# Patient Record
Sex: Female | Born: 1990 | Race: Black or African American | Hispanic: No | Marital: Single | Smoking: Never smoker
Health system: Southern US, Community
[De-identification: ages and names within clinical notes are randomized; demographics above are authoritative.]

## PROBLEM LIST (undated history)

## (undated) ENCOUNTER — Inpatient Hospital Stay (HOSPITAL_COMMUNITY): Payer: Self-pay

## (undated) DIAGNOSIS — Z973 Presence of spectacles and contact lenses: Secondary | ICD-10-CM

## (undated) DIAGNOSIS — E282 Polycystic ovarian syndrome: Secondary | ICD-10-CM

## (undated) DIAGNOSIS — G253 Myoclonus: Secondary | ICD-10-CM

## (undated) DIAGNOSIS — F329 Major depressive disorder, single episode, unspecified: Secondary | ICD-10-CM

## (undated) DIAGNOSIS — Z8659 Personal history of other mental and behavioral disorders: Secondary | ICD-10-CM

## (undated) DIAGNOSIS — Z8669 Personal history of other diseases of the nervous system and sense organs: Secondary | ICD-10-CM

## (undated) DIAGNOSIS — N9489 Other specified conditions associated with female genital organs and menstrual cycle: Secondary | ICD-10-CM

## (undated) DIAGNOSIS — Z87440 Personal history of urinary (tract) infections: Secondary | ICD-10-CM

## (undated) DIAGNOSIS — B999 Unspecified infectious disease: Secondary | ICD-10-CM

## (undated) HISTORY — DX: Personal history of other mental and behavioral disorders: Z86.59

## (undated) HISTORY — PX: DILATION AND CURETTAGE OF UTERUS: SHX78

## (undated) HISTORY — DX: Polycystic ovarian syndrome: E28.2

## (undated) HISTORY — PX: NO PAST SURGERIES: SHX2092

## (undated) HISTORY — DX: Personal history of urinary (tract) infections: Z87.440

---
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
## 2010-05-31 ENCOUNTER — Emergency Department (HOSPITAL_COMMUNITY)
Admission: EM | Admit: 2010-05-31 | Discharge: 2010-05-31 | Disposition: A | Discharge status: Home / Self Care | Payer: BC Managed Care – PPO | Attending: Emergency Medicine | Admitting: Emergency Medicine

## 2010-05-31 DIAGNOSIS — G43909 Migraine, unspecified, not intractable, without status migrainosus: Secondary | ICD-10-CM | POA: Insufficient documentation

## 2011-02-14 ENCOUNTER — Ambulatory Visit (INDEPENDENT_AMBULATORY_CARE_PROVIDER_SITE_OTHER): Payer: BC Managed Care – PPO

## 2011-02-14 DIAGNOSIS — N760 Acute vaginitis: Secondary | ICD-10-CM

## 2011-02-14 DIAGNOSIS — N898 Other specified noninflammatory disorders of vagina: Secondary | ICD-10-CM

## 2011-04-18 ENCOUNTER — Emergency Department (HOSPITAL_COMMUNITY)
Admission: EM | Admit: 2011-04-18 | Discharge: 2011-04-18 | Disposition: A | Discharge status: Home / Self Care | Payer: Self-pay | Attending: Emergency Medicine | Admitting: Emergency Medicine

## 2011-04-18 ENCOUNTER — Encounter (HOSPITAL_COMMUNITY): Payer: Self-pay | Admitting: *Deleted

## 2011-04-18 DIAGNOSIS — B349 Viral infection, unspecified: Secondary | ICD-10-CM

## 2011-04-18 DIAGNOSIS — R197 Diarrhea, unspecified: Secondary | ICD-10-CM | POA: Insufficient documentation

## 2011-04-18 DIAGNOSIS — Z79899 Other long term (current) drug therapy: Secondary | ICD-10-CM | POA: Insufficient documentation

## 2011-04-18 DIAGNOSIS — R112 Nausea with vomiting, unspecified: Secondary | ICD-10-CM | POA: Insufficient documentation

## 2011-04-18 DIAGNOSIS — B9789 Other viral agents as the cause of diseases classified elsewhere: Secondary | ICD-10-CM | POA: Insufficient documentation

## 2011-04-18 DIAGNOSIS — R1110 Vomiting, unspecified: Secondary | ICD-10-CM

## 2011-04-18 LAB — URINALYSIS, ROUTINE W REFLEX MICROSCOPIC
Ketones, ur: 15 mg/dL — AB
Nitrite: NEGATIVE
Specific Gravity, Urine: 1.023 (ref 1.005–1.030)
pH: 7 (ref 5.0–8.0)

## 2011-04-18 LAB — POCT I-STAT, CHEM 8
Calcium, Ion: 1.22 mmol/L (ref 1.12–1.32)
Chloride: 104 mEq/L (ref 96–112)
Glucose, Bld: 85 mg/dL (ref 70–99)
HCT: 51 % — ABNORMAL HIGH (ref 36.0–46.0)

## 2011-04-18 LAB — URINE MICROSCOPIC-ADD ON

## 2011-04-18 MED ORDER — SODIUM CHLORIDE 0.9 % IV BOLUS (SEPSIS)
1000.0000 mL | Freq: Once | INTRAVENOUS | Status: AC
  Administered 2011-04-18: 1000 mL via INTRAVENOUS

## 2011-04-18 MED ORDER — ONDANSETRON HCL 4 MG/2ML IJ SOLN
4.0000 mg | Freq: Once | INTRAMUSCULAR | Status: AC
  Administered 2011-04-18: 4 mg via INTRAVENOUS
  Filled 2011-04-18: qty 2

## 2011-04-18 MED ORDER — PROMETHAZINE HCL 25 MG PO TABS: 25.0000 mg | ORAL_TABLET | Freq: Four times a day (QID) | ORAL | Status: DC | PRN

## 2011-04-18 MED ORDER — MORPHINE SULFATE 4 MG/ML IJ SOLN
4.0000 mg | Freq: Once | INTRAMUSCULAR | Status: AC
  Administered 2011-04-18: 4 mg via INTRAVENOUS
  Filled 2011-04-18: qty 1

## 2011-04-18 NOTE — ED Provider Notes (Signed)
History     CSN: [id number]  Arrival date & time 04/18/11  1901   First MD Initiated Contact with Patient 04/18/11 2012      Chief Complaint  Patient presents with  . Emesis     Patient is a 21 y.o. female presenting with vomiting. The history is provided by the patient.  Emesis  This is a new problem. The current episode started 6 to 12 hours ago. The problem has been gradually improving. The emesis has an appearance of stomach contents. There has been no fever. Associated symptoms include abdominal pain and diarrhea. Pertinent negatives include no chills, no cough, no fever and no URI.   patient reports onset of nausea vomiting and diarrhea this morning. States she has had approximately 15 or more episodes of vomiting and 3-4 episodes of diarrhea. She also complains of diffuse intermittent abdominal pain that is worse just before vomiting or diarrhea and seems to improve after. She denies cough, fever, urinary tract infection symptoms or other associated symptoms.`  History reviewed. No pertinent past medical history.  History reviewed. No pertinent past surgical history.  History reviewed. No pertinent family history.  History  Substance Use Topics  . Smoking status: Never Smoker   . Smokeless tobacco: Not on file  . Alcohol Use: No    OB History    Grav Para Term Preterm Abortions TAB SAB Ect Mult Living                  Review of Systems  Constitutional: Negative.  Negative for fever and chills.  HENT: Negative.   Eyes: Negative.   Respiratory: Negative.  Negative for cough.   Cardiovascular: Negative.   Gastrointestinal: Positive for vomiting, abdominal pain and diarrhea.  Genitourinary: Negative.   Musculoskeletal: Negative.   Skin: Negative.   Neurological: Negative.   Hematological: Negative.   Psychiatric/Behavioral: Negative.     Allergies  Sulfa antibiotics  Home Medications   Current Outpatient Rx  Name Route Sig Dispense Refill  .  AMITRIPTYLINE HCL 25 MG PO TABS Oral Take 25 mg by mouth at bedtime.    Marland Kitchen LEVETIRACETAM 750 MG PO TABS Oral Take 1,500 mg by mouth 2 (two) times daily.      BP 122/72  Pulse 123  Temp(Src) 98.2 F (36.8 C) (Oral)  Resp 18  SpO2 99%  LMP 04/11/2011  Physical Exam  Constitutional: She is oriented to person, place, and time. She appears well-developed and well-nourished.  HENT:  Head: Normocephalic and atraumatic.  Eyes: Conjunctivae are normal.  Neck: Neck supple.  Cardiovascular: Normal rate and regular rhythm.   Pulmonary/Chest: Effort normal and breath sounds normal.  Abdominal: Soft. Bowel sounds are normal.       No focal abdominal TTP  Musculoskeletal: Normal range of motion.  Neurological: She is alert and oriented to person, place, and time.  Skin: Skin is warm and dry. No erythema.  Psychiatric: She has a normal mood and affect.    ED Course  Procedures  The patient reports feeling much better after the fluids and medication. There have been no further episodes of vomiting or diarrhea since arrival to the department. She has been tolerating PO fluids prior to discharge. We will plan to discharge home with medication for nausea and encourage followup with PCP as needed. Patient agreeable with plan.  Labs Reviewed  URINALYSIS, ROUTINE W REFLEX MICROSCOPIC - Abnormal; Notable for the following:    APPearance CLOUDY (*)    Ketones, ur 15 (*)  Leukocytes, UA SMALL (*)    All other components within normal limits  URINE MICROSCOPIC-ADD ON - Abnormal; Notable for the following:    Squamous Epithelial / LPF MANY (*)    All other components within normal limits  POCT I-STAT, CHEM 8 - Abnormal; Notable for the following:    Hemoglobin 17.3 (*)    HCT 51.0 (*)    All other components within normal limits  POCT PREGNANCY, URINE   Results for orders placed during the hospital encounter of 04/18/11  URINALYSIS, ROUTINE W REFLEX MICROSCOPIC      Component Value Range    Color, Urine YELLOW  YELLOW    APPearance CLOUDY (*) CLEAR    Specific Gravity, Urine 1.023  1.005 - 1.030    pH 7.0  5.0 - 8.0    Glucose, UA NEGATIVE  NEGATIVE (mg/dL)   Hgb urine dipstick NEGATIVE  NEGATIVE    Bilirubin Urine NEGATIVE  NEGATIVE    Ketones, ur 15 (*) NEGATIVE (mg/dL)   Protein, ur NEGATIVE  NEGATIVE (mg/dL)   Urobilinogen, UA 1.0  0.0 - 1.0 (mg/dL)   Nitrite NEGATIVE  NEGATIVE    Leukocytes, UA SMALL (*) NEGATIVE   POCT PREGNANCY, URINE      Component Value Range   Preg Test, Ur NEGATIVE  NEGATIVE   URINE MICROSCOPIC-ADD ON      Component Value Range   Squamous Epithelial / LPF MANY (*) RARE    WBC, UA 0-2  <3 (WBC/hpf)   Urine-Other MUCOUS PRESENT    POCT I-STAT, CHEM 8      Component Value Range   Sodium 142  135 - 145 (mEq/L)   Potassium 3.8  3.5 - 5.1 (mEq/L)   Chloride 104  96 - 112 (mEq/L)   BUN 10  6 - 23 (mg/dL)   Creatinine, Ser 0.27  0.50 - 1.10 (mg/dL)   Glucose, Bld 85  70 - 99 (mg/dL)   Calcium, Ion 2.53  6.64 - 1.32 (mmol/L)   TCO2 25  0 - 100 (mmol/L)   Hemoglobin 17.3 (*) 12.0 - 15.0 (g/dL)   HCT 40.3 (*) 47.4 - 46.0 (%)       1. Vomiting and diarrhea   2. Viral syndrome       MDM  HPI/PE and clinical findings/course c/w 1. Vomiting and diarrhea (no further episodes of V/D, feeling much better after IV hydration , intermittent diffuse abdominal pain without focal TTP, resolved, labs without acute findings) 2. Mild dehydration 3. Viral Syndrome   Medical screening examination/treatment/procedure(s) were performed by non-physician practitioner and as supervising physician I was immediately available for consultation/collaboration. Osvaldo Human, M.D.      Roma Kayser Schorr, NP 04/18/11 2236  Carleene Cooper III, MD 04/20/11 [phone]

## 2011-04-18 NOTE — ED Notes (Signed)
Vomiting and diarrhea since yesterday with intermittent mild abd pain. lmp this past week

## 2011-04-18 NOTE — Discharge Instructions (Signed)
Please review the instructions below. Your labwork tonight was completely normal. Your urine shows that you were mildly dehydrated. The likely source of your nausea, vomiting and diarrhea is a viral syndrome. Take the medication as needed for nausea. If diarrhea returns and persists you can get over-the-counter Imodium and take as directed. Rest and drink plenty of liquids for the next 24-48 hours. Return for worsening symptoms, otherwise follow up with a primary care physician. See "[facility]" number above,  and the resource list below to assist you in getting established with a primary care physician.    B.R.A.T. Diet Your doctor has recommended the B.R.A.T. diet for you or your child until the condition improves. This is often used to help control diarrhea and vomiting symptoms. If you or your child can tolerate clear liquids, you may have:  Bananas.   Rice.   Applesauce.   Toast (and other simple starches such as crackers, potatoes, noodles).  Be sure to avoid dairy products, meats, and fatty foods until symptoms are better. Fruit juices such as apple, grape, and prune juice can make diarrhea worse. Avoid these. Continue this diet for 2 days or as instructed by your caregiver. Document Released: 01/18/2005 Document Revised: 01/07/2011 Document Reviewed: 07/07/2006 [facility] Patient Information 2012 [place], [place].Diet for Diarrhea, Adult Having frequent, runny stools (diarrhea) has many causes. Diarrhea may be caused or worsened by food or drink. Diarrhea may be relieved by changing your diet. IF YOU ARE NOT TOLERATING SOLID FOODS:  Drink enough water and fluids to keep your urine clear or pale yellow.   Avoid sugary drinks and sodas as well as milk-based beverages.   Avoid beverages containing caffeine and alcohol.   You may try rehydrating beverages. You can make your own by following this recipe:    tsp table salt.    tsp baking soda.   ? tsp salt substitute (potassium  chloride).   1 tbs + 1 tsp sugar.   1 qt water.  As your stools become more solid, you can start eating solid foods. Add foods one at a time. If a certain food causes your diarrhea to get worse, avoid that food and try other foods. A low fiber, low-fat, and lactose-free diet is recommended. Small, frequent meals may be better tolerated.  Starches  Allowed:  White, [place], and pita breads, plain rolls, buns, bagels. Plain muffins, matzo. Soda, saltine, or graham crackers. Pretzels, melba toast, zwieback. Cooked cereals made with water: cornmeal, farina, cream cereals. Dry cereals: refined corn, wheat, rice. Potatoes prepared any way without skins, refined macaroni, spaghetti, noodles, refined rice.   Avoid:  Bread, rolls, or crackers made with whole wheat, multi-grains, rye, bran seeds, nuts, or coconut. Corn tortillas or taco shells. Cereals containing whole grains, multi-grains, bran, coconut, nuts, or raisins. Cooked or dry oatmeal. Coarse wheat cereals, granola. Cereals advertised as "high-fiber." Potato skins. Whole grain pasta, wild or brown rice. Popcorn. Sweet potatoes/yams. Sweet rolls, doughnuts, waffles, pancakes, sweet breads.  Vegetables  Allowed: Strained tomato and vegetable juices. Most well-cooked and canned vegetables without seeds. Fresh: Tender lettuce, cucumber without the skin, cabbage, spinach, bean sprouts.   Avoid: Fresh, cooked, or canned: Artichokes, baked beans, beet greens, broccoli, Brussels sprouts, corn, kale, legumes, peas, sweet potatoes. Cooked: Green or red cabbage, spinach. Avoid large servings of any vegetables, because vegetables shrink when cooked, and they contain more fiber per serving than fresh vegetables.  Fruit  Allowed: All fruit juices except prune juice. Cooked or canned: Apricots, applesauce, cantaloupe, cherries,  fruit cocktail, grapefruit, grapes, kiwi, mandarin oranges, peaches, pears, plums, watermelon. Fresh: Apples without skin, ripe banana,  grapes, cantaloupe, cherries, grapefruit, peaches, oranges, plums. Keep servings limited to  cup or 1 piece.   Avoid: Fresh: Apple with skin, apricots, mango, pears, raspberries, strawberries. Prune juice, stewed or dried prunes. Dried fruits, raisins, dates. Large servings of all fresh fruits.  Meat and Meat Substitutes  Allowed: Ground or well-cooked tender beef, ham, veal, lamb, pork, or poultry. Eggs, plain cheese. Fish, oysters, shrimp, lobster, other seafoods. Liver, organ meats.   Avoid: Tough, fibrous meats with gristle. Peanut butter, smooth or chunky. Cheese, nuts, seeds, legumes, dried peas, beans, lentils.  Milk  Allowed: Yogurt, lactose-free milk, kefir, drinkable yogurt, buttermilk, soy milk.   Avoid: Milk, chocolate milk, beverages made with milk, such as milk shakes.  Soups  Allowed: Bouillon, broth, or soups made from allowed foods. Any strained soup.   Avoid: Soups made from vegetables that are not allowed, cream or milk-based soups.  Desserts and Sweets  Allowed: Sugar-free gelatin, sugar-free frozen ice pops made without sugar alcohol.   Avoid: Plain cakes and cookies, pie made with allowed fruit, pudding, custard, cream pie. Gelatin, fruit, ice, sherbet, frozen ice pops. Ice cream, ice milk without nuts. Plain hard candy, honey, jelly, molasses, syrup, sugar, chocolate syrup, gumdrops, marshmallows.  Fats and Oils  Allowed: Avoid any fats and oils.   Avoid: Seeds, nuts, olives, avocados. Margarine, butter, cream, mayonnaise, salad oils, plain salad dressings made from allowed foods. Plain gravy, crisp bacon without rind.  Beverages  Allowed: Water, decaffeinated teas, oral rehydration solutions, sugar-free beverages.   Avoid: Fruit juices, caffeinated beverages (coffee, tea, soda or pop), alcohol, sports drinks, or lemon-lime soda or pop.  Condiments  Allowed: Ketchup, mustard, horseradish, vinegar, cream sauce, cheese sauce, cocoa powder. Spices in  moderation: allspice, basil, bay leaves, celery powder or leaves, cinnamon, cumin powder, curry powder, ginger, mace, marjoram, onion or garlic powder, oregano, paprika, parsley flakes, ground pepper, rosemary, sage, savory, tarragon, thyme, turmeric.   Avoid: Coconut, honey.  Weight Monitoring: Weigh yourself every day. You should weigh yourself in the morning after you urinate and before you eat breakfast. Wear the same amount of clothing when you weigh yourself. Record your weight daily. Bring your recorded weights to your clinic visits. Tell your caregiver right away if you have gained 3 lb/1.4 kg or more in 1 day, 5 lb/2.3 kg in a week, or whatever amount you were told to report. SEEK IMMEDIATE MEDICAL CARE IF:   You are unable to keep fluids down.   You start to throw up (vomit) or diarrhea keeps coming back (persistent).   Abdominal pain develops, increases, or can be felt in one place (localizes).   You have an oral temperature above 102 F (38.9 C), not controlled by medicine.   Diarrhea contains blood or mucus.   You develop excessive weakness, dizziness, fainting, or extreme thirst.  MAKE SURE YOU:   Understand these instructions.   Will watch your condition.   Will get help right away if you are not doing well or get worse.  Document Released: 04/10/2003 Document Revised: 01/07/2011 Document Reviewed: 08/01/2008 [facility] Patient Information 2012 [place], [place].Nausea and Vomiting Nausea means you feel sick to your stomach. Throwing up (vomiting) is a reflex where stomach contents come out of your mouth. HOME CARE   Take medicine as told by your doctor.   Do not force yourself to eat. However, you do need to drink fluids.  If you feel like eating, eat a normal diet as told by your doctor.   Eat rice, wheat, potatoes, bread, lean meats, yogurt, fruits, and vegetables.   Avoid high-fat foods.   Drink enough fluids to keep your pee (urine) clear or pale yellow.    Ask your doctor how to replace body fluid losses (rehydrate). Signs of body fluid loss (dehydration) include:   Feeling very thirsty.   Dry lips and mouth.   Feeling dizzy.   Dark pee.   Peeing less than normal.   Feeling confused.   Fast breathing or heart rate.  GET HELP RIGHT AWAY IF:   You have blood in your throw up.   You have black or bloody poop (stool).   You have a bad headache or stiff neck.   You feel confused.   You have bad belly (abdominal) pain.   You have chest pain or trouble breathing.   You do not pee at least once every 8 hours.   You have cold, clammy skin.   You keep throwing up after 24 to 48 hours.   You have a fever.  MAKE SURE YOU:   Understand these instructions.   Will watch your condition.   Will get help right away if you are not doing well or get worse.  Document Released: 07/07/2007 Document Revised: 01/07/2011 Document Reviewed: 06/19/2010 [facility] Patient Information 2012 [place], [place].Viral Syndrome You or your child has Viral Syndrome. It is the most common infection causing "colds" and infections in the nose, throat, sinuses, and breathing tubes. Sometimes the infection causes nausea, vomiting, or diarrhea. The germ that causes the infection is a virus. No antibiotic or other medicine will kill it. There are medicines that you can take to make you or your child more comfortable.  HOME CARE INSTRUCTIONS   Rest in bed until you start to feel better.   If you have diarrhea or vomiting, eat small amounts of crackers and toast. Soup is helpful.   Do not give aspirin or medicine that contains aspirin to children.   Only take over-the-counter or prescription medicines for pain, discomfort, or fever as directed by your caregiver.  SEEK IMMEDIATE MEDICAL CARE IF:   You or your child has not improved within one week.   You or your child has pain that is not at least partially relieved by over-the-counter medicine.   Thick,  colored mucus or blood is coughed up.   Discharge from the nose becomes thick yellow or green.   Diarrhea or vomiting gets worse.   There is any major change in your or your child's condition.   You or your child develops a skin rash, stiff neck, severe headache, or are unable to hold down food or fluid.   You or your child has an oral temperature above 102 F (38.9 C), not controlled by medicine.   Your baby is older than 3 months with a rectal temperature of 102 F (38.9 C) or higher.   Your baby is 41 months old or younger with a rectal temperature of 100.4 F (38 C) or higher.  Document Released: 01/03/2006 Document Revised: 01/07/2011 Document Reviewed: 01/04/2007 [facility] Patient Information 2012 [place], [place].  RESOURCE GUIDE  Dental Problems  Patients with Medicaid: [facility] [phone] [address]  [address] Phone:  [phone]                                                  Phone:  [phone]  If unable to pay or uninsured, contact:  [facility] or [facility] to become qualified for the adult dental clinic.  Chronic Pain Problems Contact [facility]  [phone] Patients need to be referred by their primary care doctor.  Insufficient Money for Medicine Contact [employer]:  call "211" or [facility] [phone].  No Primary Care Doctor Call [facility]  [phone] Other agencies that provide inexpensive medical care    [facility]  [phone]    [facility]  [phone]    [facility]  [phone]    [facility]  [phone]    [facility]  [phone]    [facility]  [phone]  Psychological Services [facility]  [phone] [facility] Services  [phone] [facility]   [phone] (emergency services [phone])  Substance Abuse  Resources Alcohol and Drug Services  641-873-3721 [facility] ([phone] [facility] [phone] Floydene Flock ([phone] Residential & Outpatient Substance Abuse Program  [phone]  Abuse/Neglect [facility] Child Abuse Hotline 302-077-8714 [facility] Child Abuse Hotline 207-619-4436 (After Hours)  Emergency Shelter [facility] 7794424771  Maternity Homes [facility] [place] [facility] [phone] [facility] Services [phone]  MRSA Hotline #:   417-688-5266    [facility] Resources  [facility] [place]     [employer]                          [facility] [address] [place]                       [address]      [address]  Blondell Reveal Phone:  [phone]                                   Phone:  [phone]                 Phone:  [phone]  [facility] Phone:  [phone]  [facility] Child Abuse Hotline 747-269-4688 [phone] (After Hours)

## 2011-06-23 ENCOUNTER — Encounter (HOSPITAL_COMMUNITY): Payer: Self-pay | Admitting: *Deleted

## 2011-06-23 ENCOUNTER — Inpatient Hospital Stay (HOSPITAL_COMMUNITY)
Admission: AD | Admit: 2011-06-23 | Discharge: 2011-06-23 | Disposition: A | Discharge status: Home / Self Care | Payer: Medicaid Other | Source: Ambulatory Visit | Attending: Obstetrics & Gynecology | Admitting: Obstetrics & Gynecology

## 2011-06-23 DIAGNOSIS — O219 Vomiting of pregnancy, unspecified: Secondary | ICD-10-CM

## 2011-06-23 DIAGNOSIS — O210 Mild hyperemesis gravidarum: Secondary | ICD-10-CM | POA: Insufficient documentation

## 2011-06-23 DIAGNOSIS — G43909 Migraine, unspecified, not intractable, without status migrainosus: Secondary | ICD-10-CM | POA: Insufficient documentation

## 2011-06-23 HISTORY — DX: Myoclonus: G25.3

## 2011-06-23 HISTORY — DX: Major depressive disorder, single episode, unspecified: F32.9

## 2011-06-23 LAB — URINALYSIS, ROUTINE W REFLEX MICROSCOPIC
Bilirubin Urine: NEGATIVE
Glucose, UA: NEGATIVE mg/dL
Hgb urine dipstick: NEGATIVE
Ketones, ur: NEGATIVE mg/dL
Nitrite: NEGATIVE
pH: 6 (ref 5.0–8.0)

## 2011-06-23 MED ORDER — PROMETHAZINE HCL 25 MG PO TABS
12.5000 mg | ORAL_TABLET | ORAL | Status: AC
  Administered 2011-06-23: 12.5 mg via ORAL
  Filled 2011-06-23: qty 1

## 2011-06-23 MED ORDER — ACETAMINOPHEN 500 MG PO TABS
1000.0000 mg | ORAL_TABLET | ORAL | Status: AC
  Administered 2011-06-23: 1000 mg via ORAL
  Filled 2011-06-23: qty 2

## 2011-06-23 MED ORDER — PROMETHAZINE HCL 12.5 MG PO TABS: 12.5000 mg | ORAL_TABLET | Freq: Four times a day (QID) | ORAL | Status: DC | PRN

## 2011-06-23 NOTE — MAU Note (Signed)
Headache being going on since yesterday. Has a history of migraines. Has not taken anything. Not been able to keep any food done.

## 2011-06-23 NOTE — MAU Provider Note (Signed)
Sherri Warren [id number] @6  w by LMP Chief Complaint  Patient presents with  . Migraine  . Morning Sickness     First Provider Initiated Contact with Patient 06/23/11 2157      SUBJECTIVE  HPI: Pt presents to MAU with migraine that started yesterday and nausea with vomiting x3 today.  She is able to keep down fluids without difficulty but has been unable to eat much today because of her nausea.  She denies abdominal or back pain, LOF, vaginal bleeding, vaginal itching/burning, urinary symptoms, h/a, dizziness, n/v, or fever/chills.   She has applied for Medicaid after her PPT at [facility] but does not currently have insurance.    Past Medical History  Diagnosis Date  . Migraine   . Depression   . Myoclonic jerking    History reviewed. No pertinent past surgical history. History   Social History  . Marital Status: Single    Spouse Name: N/A    Number of Children: N/A  . Years of Education: N/A   Occupational History  . Not on file.   Social History Main Topics  . Smoking status: Never Smoker   . Smokeless tobacco: Not on file  . Alcohol Use: No  . Drug Use:   . Sexually Active: Yes    Birth Control/ Protection: None   Other Topics Concern  . Not on file   Social History Narrative  . No narrative on file   No current facility-administered medications on file prior to encounter.   Current Outpatient Prescriptions on File Prior to Encounter  Medication Sig Dispense Refill  . amitriptyline (ELAVIL) 25 MG tablet Take 25 mg by mouth at bedtime.      . levETIRAcetam (KEPPRA) 750 MG tablet Take 1,500 mg by mouth 2 (two) times daily.       Allergies  Allergen Reactions  . Sulfa Antibiotics Rash    ROS: Pertinent items in HPI  OBJECTIVE Blood pressure 130/72, pulse 114, temperature 98.5 F (36.9 C), temperature source Oral, resp. rate 18, last menstrual period 05/08/2011.  GENERAL: Well-developed, well-nourished female in no acute distress.  HEENT:  Normocephalic, good dentition HEART: normal rate RESP: normal effort ABDOMEN: Soft, nontender EXTREMITIES: Nontender, no edema NEURO: Alert and oriented SPECULUM EXAM: Deferred   LAB RESULTS Results for orders placed during the hospital encounter of 06/23/11 (from the past 24 hour(s))  URINALYSIS, ROUTINE W REFLEX MICROSCOPIC     Status: Abnormal   Collection Time   06/23/11  9:15 PM      Component Value Range   Color, Urine YELLOW  YELLOW    APPearance CLEAR  CLEAR    Specific Gravity, Urine >1.030 (*) 1.005 - 1.030    pH 6.0  5.0 - 8.0    Glucose, UA NEGATIVE  NEGATIVE (mg/dL)   Hgb urine dipstick NEGATIVE  NEGATIVE    Bilirubin Urine NEGATIVE  NEGATIVE    Ketones, ur NEGATIVE  NEGATIVE (mg/dL)   Protein, ur NEGATIVE  NEGATIVE (mg/dL)   Urobilinogen, UA 0.2  0.0 - 1.0 (mg/dL)   Nitrite NEGATIVE  NEGATIVE    Leukocytes, UA NEGATIVE  NEGATIVE   POCT PREGNANCY, URINE     Status: Abnormal   Collection Time   06/23/11  9:21 PM      Component Value Range   Preg Test, Ur POSITIVE (*) NEGATIVE      ASSESSMENT N/V of pregnancy Migraine  PLAN Phenergan 12.5 mg PO and Tylenol 1000 mg PO  In MAU with  full relief of h/a and nausea  D/C home Phenergan 12.5 mg PO Q 6 hours F/U with early prenatal care Return to MAU as needed   LEFTWICH-KIRBY, Shaunie Boehm 06/23/2011 10:00 PM

## 2011-06-23 NOTE — Discharge Instructions (Signed)
Morning Sickness Morning sickness is when you feel sick to your stomach (nauseous) during pregnancy. This nauseous feeling may or may not come with throwing up (vomiting). It often occurs in the morning, but can be a problem any time of day. While morning sickness is unpleasant, it is usually harmless unless you develop severe and continual vomiting (hyperemesis gravidarum). This condition requires more intense treatment. CAUSES  The cause of morning sickness is not completely known but seems to be related to a sudden increase of two hormones:   Human chorionic gonadotropin (hCG).   Estrogen hormone.  These are elevated in the first part of the pregnancy. TREATMENT  Do not use any medicines (prescription, over-the-counter, or herbal) for morning sickness without first talking to your caregiver. Some patients are helped by the following:  Vitamin B6 (25mg every 8 hours) or vitamin B6 shots.   An antihistamine called doxylamine (10mg every 8 hours).   The herbal medication ginger.  HOME CARE INSTRUCTIONS   Taking multivitamins before getting pregnant can prevent or decrease the severity of morning sickness in most women.   Eat a piece of dry toast or unsalted crackers before getting out of bed in the morning.   Eat 5 or 6 small meals a day.   Eat dry and bland foods (rice, baked potato).   Do not drink liquids with your meals. Drink liquids between meals.   Avoid greasy, fatty, and spicy foods.   Get someone to cook for you if the smell of any food causes nausea and vomiting.   Avoid vitamin pills with iron because iron can cause nausea.   Snack on protein foods between meals if you are hungry.   Eat unsweetened gelatins for deserts.   Wear an acupressure wristband (worn for sea sickness) may be helpful.   Acupuncture may be helpful.   Do not smoke.   Get a humidifier to keep the air in your house free of odors.  SEEK MEDICAL CARE IF:   Your home remedies are not working  and you need medication.   You feel dizzy or lightheaded.   You are losing weight.   You need help with your diet.  SEEK IMMEDIATE MEDICAL CARE IF:   You have persistent and uncontrolled nausea and vomiting.   You pass out (faint).   You have a fever.  MAKE SURE YOU:   Understand these instructions.   Will watch your condition.   Will get help right away if you are not doing well or get worse.  Document Released: 03/11/2006 Document Revised: 01/07/2011 Document Reviewed: 01/06/2007 [employer] Patient Information 2012 [employer]. 

## 2011-07-04 ENCOUNTER — Telehealth: Payer: Self-pay | Admitting: Family Medicine

## 2011-07-04 ENCOUNTER — Encounter (HOSPITAL_COMMUNITY): Payer: Self-pay | Admitting: *Deleted

## 2011-07-04 ENCOUNTER — Inpatient Hospital Stay (HOSPITAL_COMMUNITY)
Admission: AD | Admit: 2011-07-04 | Discharge: 2011-07-04 | Disposition: A | Discharge status: Home / Self Care | Payer: Medicaid Other | Source: Ambulatory Visit | Attending: Obstetrics and Gynecology | Admitting: Obstetrics and Gynecology

## 2011-07-04 DIAGNOSIS — R05 Cough: Secondary | ICD-10-CM | POA: Insufficient documentation

## 2011-07-04 DIAGNOSIS — O99891 Other specified diseases and conditions complicating pregnancy: Secondary | ICD-10-CM | POA: Insufficient documentation

## 2011-07-04 DIAGNOSIS — J029 Acute pharyngitis, unspecified: Secondary | ICD-10-CM

## 2011-07-04 DIAGNOSIS — R059 Cough, unspecified: Secondary | ICD-10-CM | POA: Insufficient documentation

## 2011-07-04 MED ORDER — MENTHOL 5.4 MG MT LOZG: 1.0000 | LOZENGE | Freq: Four times a day (QID) | OROMUCOSAL | Status: DC | PRN

## 2011-07-04 NOTE — MAU Note (Signed)
Pt reports having a sore throat x 2 days. Denies fever. Reports some mild nasal congestion

## 2011-07-04 NOTE — Discharge Instructions (Signed)
Medications safe in pregnancy: - Nyquil - Dayquil - Mucinex  Sore Throat Sore throats may be caused by bacteria and viruses. They may also be caused by:  Smoking.   Pollution.   Allergies.  If a sore throat is due to strep infection (a bacterial infection), you may need:  A throat swab.   A culture test to verify the strep infection.  You will need one of these:  An antibiotic shot.   Oral medicine for a full 10 days.  Strep infection is very contagious. A doctor should check any close contacts who have a sore throat or fever. A sore throat caused by a virus infection will usually last only 3-4 days. Antibiotics will not treat a viral sore throat.  Infectious mononucleosis (a viral disease), however, can cause a sore throat that lasts for up to 3 weeks. Mononucleosis can be diagnosed with blood tests. You must have been sick for at least 1 week in order for the test to give accurate results. HOME CARE INSTRUCTIONS   To treat a sore throat, take mild pain medicine.   Increase your fluids.   Eat a soft diet.   Do not smoke.   Gargling with warm water or salt water (1 tsp. salt in 8 oz. water) can be helpful.   Try throat sprays or lozenges or sucking on hard candy to ease the symptoms.  Call your doctor if your sore throat lasts longer than 1 week.  SEEK IMMEDIATE MEDICAL CARE IF:  You have difficulty breathing.   You have increased swelling in the throat.   You have pain so severe that you are unable to swallow fluids or your saliva.   You have a severe headache, a high fever, vomiting, or a red rash.  Document Released: 02/26/2004 Document Revised: 01/07/2011 Document Reviewed: 01/05/2007 [facility] Patient Information 2012 [place], [place].

## 2011-07-04 NOTE — Telephone Encounter (Signed)
Patient notified of negative results.

## 2011-07-04 NOTE — MAU Provider Note (Addendum)
  History     CSN: [id number]  Arrival date and time: 07/04/11 1622   None     Chief Complaint  Patient presents with  . URI  . Sore Throat   HPI This is a 21 year old G2 P0 0108 weeks 1 day who presents the MAU with 2-3 days of worsening sore throat. The sore throat was worse today. She has tried fluids which have not been helpful. There are no provoking factors. She has continued to uterine drink without difficulty. She denies fevers, chills, nausea, vomiting. She does have a dry cough and has nasal congestion. She denies sick contacts.  OB History    Grav Para Term Preterm Abortions TAB SAB Ect Mult Living   2    1 1           Past Medical History  Diagnosis Date  . Migraine   . Depression   . Myoclonic jerking     History reviewed. No pertinent past surgical history.  Family History  Problem Relation Age of Onset  . Anesthesia problems Neg Hx   . Hypotension Neg Hx   . Malignant hyperthermia Neg Hx   . Pseudochol deficiency Neg Hx     History  Substance Use Topics  . Smoking status: Never Smoker   . Smokeless tobacco: Not on file  . Alcohol Use: No    Allergies:  Allergies  Allergen Reactions  . Sulfa Antibiotics Rash    Prescriptions prior to admission  Medication Sig Dispense Refill  . flintstones complete (FLINTSTONES) 60 MG chewable tablet Chew 1 tablet by mouth daily.      . promethazine (PHENERGAN) 12.5 MG tablet Take 1 tablet (12.5 mg total) by mouth every 6 (six) hours as needed for nausea.  30 tablet  0  . amitriptyline (ELAVIL) 25 MG tablet Take 25 mg by mouth at bedtime.      . levETIRAcetam (KEPPRA) 750 MG tablet Take 1,500 mg by mouth 2 (two) times daily.        ROS Physical Exam   Blood pressure 133/63, pulse 92, temperature 99.3 F (37.4 C), temperature source Oral, resp. rate 16, height 5\' 7"  (1.702 m), weight 61.508 kg (135 lb 9.6 oz), last menstrual period 05/08/2011.  Physical Exam  Constitutional: She appears well-developed and  well-nourished.  HENT:  Head: Normocephalic and atraumatic.  Eyes: EOM are normal. Pupils are equal, round, and reactive to light.  Neck: Normal range of motion. Neck supple. No JVD present. No tracheal deviation present. No thyromegaly present.       No tonsillar exudate. There is oral pharyngeal cobblestoning.  Cardiovascular: Normal rate, regular rhythm and normal heart sounds.   Respiratory: Effort normal and breath sounds normal. No stridor. No respiratory distress. She has no wheezes. She has no rales. She exhibits no tenderness.  Lymphadenopathy:    She has cervical adenopathy.    MAU Course  Procedures   Assessment and Plan  #1 pharyngitis likely viral  Rapid strep test obtained and sent. Cepacol prescription given to patient. We'll call patient with results of structures. List of over-the-counter cold medications safe in pregnancy given to the patient.   Imunique Samad JEHIEL 07/04/2011, 5:36 PM

## 2011-07-22 ENCOUNTER — Other Ambulatory Visit: Payer: Self-pay

## 2011-07-22 ENCOUNTER — Other Ambulatory Visit (HOSPITAL_COMMUNITY): Payer: Self-pay | Admitting: Obstetrics and Gynecology

## 2011-07-22 DIAGNOSIS — Z3682 Encounter for antenatal screening for nuchal translucency: Secondary | ICD-10-CM

## 2011-07-22 LAB — OB RESULTS CONSOLE RUBELLA ANTIBODY, IGM: Rubella: IMMUNE

## 2011-07-22 LAB — OB RESULTS CONSOLE ABO/RH: RH Type: POSITIVE

## 2011-07-22 LAB — OB RESULTS CONSOLE GC/CHLAMYDIA: Chlamydia: NEGATIVE

## 2011-07-22 LAB — OB RESULTS CONSOLE HEPATITIS B SURFACE ANTIGEN: Hepatitis B Surface Ag: NEGATIVE

## 2011-07-22 LAB — OB RESULTS CONSOLE RPR: RPR: NONREACTIVE

## 2011-08-06 ENCOUNTER — Ambulatory Visit (HOSPITAL_COMMUNITY)
Admission: RE | Admit: 2011-08-06 | Discharge: 2011-08-06 | Disposition: A | Discharge status: Home / Self Care | Payer: Medicaid Other | Source: Ambulatory Visit | Attending: Obstetrics and Gynecology | Admitting: Obstetrics and Gynecology

## 2011-08-06 ENCOUNTER — Encounter (HOSPITAL_COMMUNITY): Payer: Self-pay

## 2011-08-06 ENCOUNTER — Other Ambulatory Visit: Payer: Self-pay

## 2011-08-06 DIAGNOSIS — O3510X0 Maternal care for (suspected) chromosomal abnormality in fetus, unspecified, not applicable or unspecified: Secondary | ICD-10-CM | POA: Insufficient documentation

## 2011-08-06 DIAGNOSIS — Z3689 Encounter for other specified antenatal screening: Secondary | ICD-10-CM | POA: Insufficient documentation

## 2011-08-06 DIAGNOSIS — O351XX0 Maternal care for (suspected) chromosomal abnormality in fetus, not applicable or unspecified: Secondary | ICD-10-CM | POA: Insufficient documentation

## 2011-08-06 DIAGNOSIS — Z3682 Encounter for antenatal screening for nuchal translucency: Secondary | ICD-10-CM

## 2011-08-12 ENCOUNTER — Encounter (HOSPITAL_COMMUNITY): Payer: Self-pay | Admitting: *Deleted

## 2011-08-12 ENCOUNTER — Inpatient Hospital Stay (HOSPITAL_COMMUNITY)
Admission: AD | Admit: 2011-08-12 | Discharge: 2011-08-13 | Disposition: A | Discharge status: Home / Self Care | Payer: Medicaid Other | Source: Ambulatory Visit | Attending: Obstetrics & Gynecology | Admitting: Obstetrics & Gynecology

## 2011-08-12 DIAGNOSIS — Z331 Pregnant state, incidental: Secondary | ICD-10-CM

## 2011-08-12 DIAGNOSIS — R197 Diarrhea, unspecified: Secondary | ICD-10-CM | POA: Insufficient documentation

## 2011-08-12 DIAGNOSIS — O99891 Other specified diseases and conditions complicating pregnancy: Secondary | ICD-10-CM | POA: Insufficient documentation

## 2011-08-12 DIAGNOSIS — R109 Unspecified abdominal pain: Secondary | ICD-10-CM | POA: Insufficient documentation

## 2011-08-12 DIAGNOSIS — A084 Viral intestinal infection, unspecified: Secondary | ICD-10-CM

## 2011-08-12 LAB — URINALYSIS, ROUTINE W REFLEX MICROSCOPIC
Glucose, UA: NEGATIVE mg/dL
Leukocytes, UA: NEGATIVE
Specific Gravity, Urine: >1.03 — ABNORMAL HIGH (ref 1.005–1.030)
pH: 6 (ref 5.0–8.0)

## 2011-08-12 NOTE — MAU Note (Signed)
Pt. Started having stomach cramps yesterday and today she has had some cramping on and off all day.  She started having diarrhea this am and has had it all day.

## 2011-08-12 NOTE — MAU Note (Signed)
Patient states she started having abdominal cramping yesterday and has been having diarrhea, multiple episodes, today. No bleeding.

## 2011-08-13 DIAGNOSIS — A088 Other specified intestinal infections: Secondary | ICD-10-CM

## 2011-08-13 MED ORDER — LOPERAMIDE HCL 2 MG PO CAPS
4.0000 mg | ORAL_CAPSULE | Freq: Once | ORAL | Status: AC
  Administered 2011-08-13: 4 mg via ORAL
  Filled 2011-08-13: qty 2

## 2011-08-13 NOTE — MAU Provider Note (Signed)
Sherri Warren [place] y.o.G2P0010 @[redacted]w[redacted]d  by LMP Chief Complaint  Patient presents with  . Abdominal Pain  . Diarrhea     First Provider Initiated Contact with Patient 08/13/11 0003      SUBJECTIVE  HPI: HPI: Sherri Warren is a 21 y.o. year old G55P0010 female at [redacted]w[redacted]d weeks gestation who presents to MAU reporting low abd cramping since yesterday and 10 episodes of watery diarrhea today. She denies fever, chills, N/V, sick contacts, VB, vaginal discharge. She has not tried anything for her Sx and states she has been able to eat and drink well.      Past Medical History  Diagnosis Date  . Migraine   . Depression   . Myoclonic jerking    History reviewed. No pertinent past surgical history. History   Social History  . Marital Status: Single    Spouse Name: N/A    Number of Children: N/A  . Years of Education: N/A   Occupational History  . Not on file.   Social History Main Topics  . Smoking status: Never Smoker   . Smokeless tobacco: Not on file  . Alcohol Use: No  . Drug Use: No  . Sexually Active: Yes    Birth Control/ Protection: None   Other Topics Concern  . Not on file   Social History Narrative  . No narrative on file   No current facility-administered medications on file prior to encounter.   Current Outpatient Prescriptions on File Prior to Encounter  Medication Sig Dispense Refill  . flintstones complete (FLINTSTONES) 60 MG chewable tablet Chew 1 tablet by mouth daily.      Marland Kitchen levETIRAcetam (KEPPRA) 750 MG tablet Take 1,500 mg by mouth 2 (two) times daily.      . promethazine (PHENERGAN) 12.5 MG tablet Take 1 tablet (12.5 mg total) by mouth every 6 (six) hours as needed for nausea.  30 tablet  0   Allergies  Allergen Reactions  . Sulfa Antibiotics Rash    ROS: Pertinent items in HPI  OBJECTIVE Blood pressure 113/68, pulse 78, temperature 98 F (36.7 C), temperature source Axillary, resp. rate 18, height 5\' 6"  (1.676 m), weight 62.324 kg (137 lb 6.4  oz), last menstrual period 05/08/2011, SpO2 100.00%.  GENERAL: Well-developed, well-nourished female in no acute distress.  HEENT: Normocephalic, good dentition HEART: normal rate RESP: normal effort ABDOMEN: Soft, nontender EXTREMITIES: Nontender, no edema NEURO: Alert and oriented SPECULUM EXAM: deferred BIMANUAL: cervix long and closed; uterus 14 week size; no adnexal tenderness or masses   LAB RESULTS  No results found for this or any previous visit (from the past 24 hour(s)). IMAGING NA  ED COURSE Diarrhea x 2 while in MAU. Imodium given.  ASSESSMENT  1. Viral diarrhea   2. Pregnancy, incidental     PLAN D/C home Discussed diet for diarrhea, good hand hygiene. Viral gastroenteritis usually resolves spontaneously in ~48 hours. Call your provider of return if it does not resolve or if you cannot keep anything down for >24 hours.  Medication List  As of 08/16/2011  9:22 PM   STOP taking these medications         Menthol 5.4 MG Lozg         TAKE these medications         flintstones complete 60 MG chewable tablet   Chew 1 tablet by mouth daily.      levETIRAcetam 750 MG tablet   Commonly known as: KEPPRA   Take 1,500 mg by  mouth 2 (two) times daily.      promethazine 12.5 MG tablet   Commonly known as: PHENERGAN   Take 1 tablet (12.5 mg total) by mouth every 6 (six) hours as needed for nausea.          Imodium PRN Follow-up Information    Follow up with MEISINGER,TODD D, MD. (as scheduled or  as needed if symptoms worsen)    Contact information:   [address] [place] [address] [phone]         Dorathy Kinsman 08/16/2011 9:22 PM

## 2012-01-03 ENCOUNTER — Encounter (HOSPITAL_COMMUNITY): Payer: Self-pay | Admitting: *Deleted

## 2012-01-03 ENCOUNTER — Inpatient Hospital Stay (HOSPITAL_COMMUNITY)
Admission: AD | Admit: 2012-01-03 | Discharge: 2012-01-03 | Disposition: A | Discharge status: Home / Self Care | Payer: Medicaid Other | Source: Ambulatory Visit | Attending: Obstetrics and Gynecology | Admitting: Obstetrics and Gynecology

## 2012-01-03 DIAGNOSIS — B373 Candidiasis of vulva and vagina: Secondary | ICD-10-CM

## 2012-01-03 DIAGNOSIS — O2390 Unspecified genitourinary tract infection in pregnancy, unspecified trimester: Secondary | ICD-10-CM | POA: Insufficient documentation

## 2012-01-03 DIAGNOSIS — N949 Unspecified condition associated with female genital organs and menstrual cycle: Secondary | ICD-10-CM | POA: Insufficient documentation

## 2012-01-03 DIAGNOSIS — B3731 Acute candidiasis of vulva and vagina: Secondary | ICD-10-CM

## 2012-01-03 LAB — WET PREP, GENITAL: Clue Cells Wet Prep HPF POC: NONE SEEN

## 2012-01-03 MED ORDER — TERCONAZOLE 0.4 % VA CREA: 1.0000 | TOPICAL_CREAM | Freq: Every day | VAGINAL | Status: DC

## 2012-01-03 NOTE — MAU Note (Signed)
Patient states she has had a vaginal discharge for about 3 weeks. Denies any problems with the pregnancy. No pain or bleeding.

## 2012-01-03 NOTE — MAU Provider Note (Signed)
Chief Complaint:  Vaginal Discharge   None     HPI: Sherri Warren is a 21 y.o. G2P0010 at [redacted]w[redacted]d who presents to maternity admissions reporting vaginal discharge x3 weeks with onset of itching yesterday.  She describes discharge as white and thick. She reports good fetal movement, denies LOF, vaginal bleeding, urinary symptoms, h/a, dizziness, n/v, or fever/chills.     Past Medical History: Past Medical History  Diagnosis Date  . Migraine   . Depression   . Myoclonic jerking     Past Surgical History: History reviewed. No pertinent past surgical history.  Family History: Family History  Problem Relation Age of Onset  . Anesthesia problems Neg Hx   . Hypotension Neg Hx   . Malignant hyperthermia Neg Hx   . Pseudochol deficiency Neg Hx     Social History: History  Substance Use Topics  . Smoking status: Never Smoker   . Smokeless tobacco: Never Used  . Alcohol Use: No    Allergies:  Allergies  Allergen Reactions  . Sulfa Antibiotics Rash    Meds:  Prescriptions prior to admission  Medication Sig Dispense Refill  . loratadine (CLARITIN) 10 MG tablet Take 10 mg by mouth daily.      Marland Kitchen OVER THE COUNTER MEDICATION Take 1 each by mouth 2 (two) times daily. Vitafusion vitamin supplement        ROS: Pertinent findings in history of present illness.  Physical Exam  Blood pressure 116/70, pulse 100, temperature 97.6 F (36.4 C), temperature source Oral, resp. rate 16, height 5' 5.2" (1.656 m), weight 74.934 kg (165 lb 3.2 oz), last menstrual period 05/08/2011, SpO2 100.00%. GENERAL: Well-developed, well-nourished female in no acute distress.  HEENT: normocephalic HEART: normal rate RESP: normal effort ABDOMEN: Soft, non-tender, gravid appropriate for gestational age EXTREMITIES: Nontender, no edema NEURO: alert and oriented Pelvic exam: Cervix pink, visually closed, without lesion, moderate thick white discharge with clumps, vaginal walls and external genitalia with  moderate erythema Bimanual exam: Cervix 0/long/high, firm, anterior  Dilation: Closed Effacement (%): Thick Cervical Position: Posterior Exam by:: L. Leftwich, CNM   FHT:  Baseline 145 , moderate variability, accelerations present, no decelerations Contractions: q 10 mins with irritability in between   Labs: Results for orders placed during the hospital encounter of 01/03/12 (from the past 24 hour(s))  WET PREP, GENITAL     Status: Abnormal   Collection Time   01/03/12  1:20 PM      Component Value Range   Yeast Wet Prep HPF POC MODERATE (*) NONE SEEN   Trich, Wet Prep NONE SEEN  NONE SEEN   Clue Cells Wet Prep HPF POC NONE SEEN  NONE SEEN   WBC, Wet Prep HPF POC MANY (*) NONE SEEN     Assessment: 1. Candida vaginitis     Plan: Discharge home Terazol 7 F/U with your prenatal provider Return to MAU as needed   Sharen Counter Certified Nurse-Midwife 01/03/2012 1:32 PM

## 2012-01-05 NOTE — Progress Notes (Signed)
FHT from 12-2 reviewed.  Reactive NST with irreg ctx.

## 2012-02-11 ENCOUNTER — Inpatient Hospital Stay (HOSPITAL_COMMUNITY): Payer: Medicaid Other | Admitting: Anesthesiology

## 2012-02-11 ENCOUNTER — Encounter (HOSPITAL_COMMUNITY): Payer: Self-pay | Admitting: Anesthesiology

## 2012-02-11 ENCOUNTER — Encounter (HOSPITAL_COMMUNITY): Payer: Self-pay

## 2012-02-11 ENCOUNTER — Inpatient Hospital Stay (HOSPITAL_COMMUNITY)
Admission: AD | Admit: 2012-02-11 | Discharge: 2012-02-13 | DRG: 775 | Disposition: A | Discharge status: Home / Self Care | Payer: Medicaid Other | Source: Ambulatory Visit | Attending: Obstetrics and Gynecology | Admitting: Obstetrics and Gynecology

## 2012-02-11 LAB — TYPE AND SCREEN
ABO/RH(D): B POS
Antibody Screen: NEGATIVE

## 2012-02-11 LAB — PLATELET COUNT: Platelets: 170 10*3/uL (ref 150–400)

## 2012-02-11 LAB — CBC
Hemoglobin: 12.7 g/dL (ref 12.0–15.0)
MCHC: 32.1 g/dL (ref 30.0–36.0)
Platelets: ADEQUATE 10*3/uL (ref 150–400)

## 2012-02-11 LAB — RPR: RPR Ser Ql: NONREACTIVE

## 2012-02-11 MED ORDER — OXYTOCIN 40 UNITS IN LACTATED RINGERS INFUSION - SIMPLE MED
1.0000 m[IU]/min | INTRAVENOUS | Status: DC
  Administered 2012-02-11: 1 m[IU]/min via INTRAVENOUS
  Filled 2012-02-11: qty 1000

## 2012-02-11 MED ORDER — PHENYLEPHRINE 40 MCG/ML (10ML) SYRINGE FOR IV PUSH (FOR BLOOD PRESSURE SUPPORT)
80.0000 ug | PREFILLED_SYRINGE | INTRAVENOUS | Status: DC | PRN
  Filled 2012-02-11: qty 5

## 2012-02-11 MED ORDER — PENICILLIN G POTASSIUM 5000000 UNITS IJ SOLR
2.5000 10*6.[IU] | INTRAMUSCULAR | Status: DC
  Filled 2012-02-11 (×2): qty 2.5

## 2012-02-11 MED ORDER — SENNOSIDES-DOCUSATE SODIUM 8.6-50 MG PO TABS
2.0000 | ORAL_TABLET | Freq: Every day | ORAL | Status: DC
  Administered 2012-02-11: 2 via ORAL

## 2012-02-11 MED ORDER — PRENATAL MULTIVITAMIN CH: 1.0000 | ORAL_TABLET | Freq: Every day | ORAL | Status: DC

## 2012-02-11 MED ORDER — LACTATED RINGERS IV SOLN: INTRAVENOUS | Status: AC

## 2012-02-11 MED ORDER — OXYTOCIN BOLUS FROM INFUSION: 500.0000 mL | INTRAVENOUS | Status: DC

## 2012-02-11 MED ORDER — LACTATED RINGERS IV SOLN
INTRAVENOUS | Status: DC
  Administered 2012-02-11: 125 mL via INTRAVENOUS
  Administered 2012-02-11: 07:00:00 via INTRAVENOUS

## 2012-02-11 MED ORDER — PENICILLIN G POTASSIUM 5000000 UNITS IJ SOLR
2.5000 10*6.[IU] | INTRAVENOUS | Status: DC
  Administered 2012-02-11 (×2): 2.5 10*6.[IU] via INTRAVENOUS
  Filled 2012-02-11 (×5): qty 2.5

## 2012-02-11 MED ORDER — ACETAMINOPHEN 325 MG PO TABS: 650.0000 mg | ORAL_TABLET | ORAL | Status: DC | PRN

## 2012-02-11 MED ORDER — LANOLIN HYDROUS EX OINT: TOPICAL_OINTMENT | CUTANEOUS | Status: DC | PRN

## 2012-02-11 MED ORDER — EPHEDRINE 5 MG/ML INJ
10.0000 mg | INTRAVENOUS | Status: DC | PRN
  Filled 2012-02-11: qty 4

## 2012-02-11 MED ORDER — DEXTROSE 5 % IV SOLN
5.0000 10*6.[IU] | Freq: Once | INTRAVENOUS | Status: DC
  Filled 2012-02-11: qty 5

## 2012-02-11 MED ORDER — PRENATAL MULTIVITAMIN CH
1.0000 | ORAL_TABLET | Freq: Every day | ORAL | Status: DC
  Administered 2012-02-12 – 2012-02-13 (×2): 1 via ORAL
  Filled 2012-02-11 (×2): qty 1

## 2012-02-11 MED ORDER — OXYTOCIN 40 UNITS IN LACTATED RINGERS INFUSION - SIMPLE MED: 62.5000 mL/h | INTRAVENOUS | Status: DC

## 2012-02-11 MED ORDER — ONDANSETRON HCL 4 MG PO TABS: 4.0000 mg | ORAL_TABLET | ORAL | Status: DC | PRN

## 2012-02-11 MED ORDER — FLEET ENEMA 7-19 GM/118ML RE ENEM: 1.0000 | ENEMA | RECTAL | Status: DC | PRN

## 2012-02-11 MED ORDER — TETANUS-DIPHTH-ACELL PERTUSSIS 5-2.5-18.5 LF-MCG/0.5 IM SUSP: 0.5000 mL | Freq: Once | INTRAMUSCULAR | Status: DC

## 2012-02-11 MED ORDER — DIBUCAINE 1 % RE OINT: 1.0000 "application " | TOPICAL_OINTMENT | RECTAL | Status: DC | PRN

## 2012-02-11 MED ORDER — DIPHENHYDRAMINE HCL 50 MG/ML IJ SOLN: 12.5000 mg | INTRAMUSCULAR | Status: DC | PRN

## 2012-02-11 MED ORDER — CITRIC ACID-SODIUM CITRATE 334-500 MG/5ML PO SOLN: 30.0000 mL | ORAL | Status: DC | PRN

## 2012-02-11 MED ORDER — DIPHENHYDRAMINE HCL 25 MG PO CAPS: 25.0000 mg | ORAL_CAPSULE | Freq: Four times a day (QID) | ORAL | Status: DC | PRN

## 2012-02-11 MED ORDER — OXYCODONE-ACETAMINOPHEN 5-325 MG PO TABS
1.0000 | ORAL_TABLET | ORAL | Status: DC | PRN
Start: 2012-02-11 — End: 2012-02-13

## 2012-02-11 MED ORDER — IBUPROFEN 600 MG PO TABS: 600.0000 mg | ORAL_TABLET | Freq: Four times a day (QID) | ORAL | Status: DC | PRN

## 2012-02-11 MED ORDER — ZOLPIDEM TARTRATE 5 MG PO TABS: 5.0000 mg | ORAL_TABLET | Freq: Every evening | ORAL | Status: DC | PRN

## 2012-02-11 MED ORDER — OXYCODONE-ACETAMINOPHEN 5-325 MG PO TABS: 1.0000 | ORAL_TABLET | ORAL | Status: DC | PRN

## 2012-02-11 MED ORDER — PENICILLIN G POTASSIUM 5000000 UNITS IJ SOLR
5.0000 10*6.[IU] | Freq: Once | INTRAMUSCULAR | Status: AC
  Administered 2012-02-11: 5 10*6.[IU] via INTRAVENOUS
  Filled 2012-02-11: qty 5

## 2012-02-11 MED ORDER — IBUPROFEN 600 MG PO TABS
600.0000 mg | ORAL_TABLET | Freq: Four times a day (QID) | ORAL | Status: DC
  Administered 2012-02-11 – 2012-02-13 (×7): 600 mg via ORAL
  Filled 2012-02-11 (×6): qty 1

## 2012-02-11 MED ORDER — SIMETHICONE 80 MG PO CHEW
80.0000 mg | CHEWABLE_TABLET | ORAL | Status: DC | PRN
  Administered 2012-02-11: 80 mg via ORAL

## 2012-02-11 MED ORDER — LACTATED RINGERS IV SOLN: 500.0000 mL | INTRAVENOUS | Status: DC | PRN

## 2012-02-11 MED ORDER — ONDANSETRON HCL 4 MG/2ML IJ SOLN: 4.0000 mg | INTRAMUSCULAR | Status: DC | PRN

## 2012-02-11 MED ORDER — LACTATED RINGERS IV SOLN: 500.0000 mL | Freq: Once | INTRAVENOUS | Status: DC

## 2012-02-11 MED ORDER — WITCH HAZEL-GLYCERIN EX PADS: 1.0000 "application " | MEDICATED_PAD | CUTANEOUS | Status: DC | PRN

## 2012-02-11 MED ORDER — FENTANYL 2.5 MCG/ML BUPIVACAINE 1/10 % EPIDURAL INFUSION (WH - ANES)
14.0000 mL/h | INTRAMUSCULAR | Status: DC
  Administered 2012-02-11 (×2): 14 mL/h via EPIDURAL
  Filled 2012-02-11 (×2): qty 125

## 2012-02-11 MED ORDER — LIDOCAINE HCL (PF) 1 % IJ SOLN
INTRAMUSCULAR | Status: DC | PRN
  Administered 2012-02-11 (×2): 5 mL

## 2012-02-11 MED ORDER — ONDANSETRON HCL 4 MG/2ML IJ SOLN: 4.0000 mg | Freq: Four times a day (QID) | INTRAMUSCULAR | Status: DC | PRN

## 2012-02-11 MED ORDER — OXYTOCIN 40 UNITS IN LACTATED RINGERS INFUSION - SIMPLE MED
62.5000 mL/h | INTRAVENOUS | Status: AC | PRN
  Administered 2012-02-11: 62.5 mL/h via INTRAVENOUS
  Filled 2012-02-11: qty 1000

## 2012-02-11 MED ORDER — EPHEDRINE 5 MG/ML INJ: 10.0000 mg | INTRAVENOUS | Status: DC | PRN

## 2012-02-11 MED ORDER — TERBUTALINE SULFATE 1 MG/ML IJ SOLN: 0.2500 mg | Freq: Once | INTRAMUSCULAR | Status: DC | PRN

## 2012-02-11 MED ORDER — LIDOCAINE HCL (PF) 1 % IJ SOLN
30.0000 mL | INTRAMUSCULAR | Status: DC | PRN
  Administered 2012-02-11: 30 mL via SUBCUTANEOUS
  Filled 2012-02-11: qty 30

## 2012-02-11 MED ORDER — PHENYLEPHRINE 40 MCG/ML (10ML) SYRINGE FOR IV PUSH (FOR BLOOD PRESSURE SUPPORT): 80.0000 ug | PREFILLED_SYRINGE | INTRAVENOUS | Status: DC | PRN

## 2012-02-11 NOTE — Anesthesia Preprocedure Evaluation (Signed)
Anesthesia Evaluation  Patient identified by MRN, date of birth, ID band Patient awake    Reviewed: Allergy & Precautions, H&P , Patient's Chart, lab work & pertinent test results  Airway Mallampati: II TM Distance: >3 FB Neck ROM: full    Dental No notable dental hx.    Pulmonary neg pulmonary ROS,  breath sounds clear to auscultation  Pulmonary exam normal       Cardiovascular negative cardio ROS  Rhythm:regular Rate:Normal     Neuro/Psych  Headaches, negative neurological ROS  negative psych ROS   GI/Hepatic negative GI ROS, Neg liver ROS,   Endo/Other  negative endocrine ROS  Renal/GU negative Renal ROS     Musculoskeletal   Abdominal   Peds  Hematology negative hematology ROS (+)   Anesthesia Other Findings Migraine     Depression        Myoclonic jerking          Reproductive/Obstetrics (+) Pregnancy                           Anesthesia Physical Anesthesia Plan  ASA: II  Anesthesia Plan: Epidural   Post-op Pain Management:    Induction:   Airway Management Planned:   Additional Equipment:   Intra-op Plan:   Post-operative Plan:   Informed Consent: I have reviewed the patients History and Physical, chart, labs and discussed the procedure including the risks, benefits and alternatives for the proposed anesthesia with the patient or authorized representative who has indicated his/her understanding and acceptance.     Plan Discussed with:   Anesthesia Plan Comments:         Anesthesia Quick Evaluation

## 2012-02-11 NOTE — Anesthesia Procedure Notes (Signed)
Epidural Patient location during procedure: OB Start time: 02/11/2012 8:56 AM  Staffing Anesthesiologist: Angus Seller., Harrell Gave. Performed by: anesthesiologist   Preanesthetic Checklist Completed: patient identified, site marked, surgical consent, pre-op evaluation, timeout performed, IV checked, risks and benefits discussed and monitors and equipment checked  Epidural Patient position: sitting Prep: site prepped and draped and DuraPrep Patient monitoring: continuous pulse ox and blood pressure Approach: midline Injection technique: LOR air and LOR saline  Needle:  Needle type: Tuohy  Needle gauge: 17 G Needle length: 9 cm and 9 Needle insertion depth: 5 cm cm Catheter type: closed end flexible Catheter size: 19 Gauge Catheter at skin depth: 10 cm Test dose: negative  Assessment Events: blood not aspirated, injection not painful, no injection resistance, negative IV test and no paresthesia  Additional Notes Patient identified.  Risk benefits discussed including failed block, incomplete pain control, headache, nerve damage, paralysis, blood pressure changes, nausea, vomiting, reactions to medication both toxic or allergic, and postpartum back pain.  Patient expressed understanding and wished to proceed.  All questions were answered.  Sterile technique used throughout procedure and epidural site dressed with sterile barrier dressing. No paresthesia or other complications noted.The patient did not experience any signs of intravascular injection such as tinnitus or metallic taste in mouth nor signs of intrathecal spread such as rapid motor block. Please see nursing notes for vital signs.

## 2012-02-11 NOTE — Progress Notes (Signed)
Patient ID: LUDY MESSAMORE, female   DOB: [birth year], 22 y.o.   MRN: [id number] Delivery note:  The pt received an epidural and had SROM with meconium stained fluid. She progressed to full dilatation and pushed well but had decelerations and fetal tachycardia. These were treated with position change. I had the NICU staff attend the delivery. A LML episiotomy was done but the baby did not deliver so I placed a MityVac vacuum with the Bell cup and with one contraction and no pop-offs a living female infant was delivered ROP The infant was given to the NICU staff who assigned Apgars of 7 at 1 and 9 at 5 minutes. Dr. Algernon Huxley did not feel a pH was necessary. The placenta delivered intact and the uterus was normal. The LML episiotomy was repaired with 3-0 vicryl Rectal was negative. EBL 500 cc's.

## 2012-02-11 NOTE — MAU Note (Signed)
Contractions every 8 minutes since yesterday. Was dilated 2cm yesterday in office. Denies leaking of fluid or vaginal bleeding.

## 2012-02-12 ENCOUNTER — Inpatient Hospital Stay (HOSPITAL_COMMUNITY): Admission: AD | Admit: 2012-02-12 | Payer: Self-pay | Source: Ambulatory Visit | Admitting: Obstetrics and Gynecology

## 2012-02-12 LAB — ABO/RH: ABO/RH(D): B POS

## 2012-02-12 LAB — CBC
MCH: 28.1 pg (ref 26.0–34.0)
Platelets: 171 10*3/uL (ref 150–400)
RBC: 3.63 MIL/uL — ABNORMAL LOW (ref 3.87–5.11)
WBC: 21.1 10*3/uL — ABNORMAL HIGH (ref 4.0–10.5)

## 2012-02-12 MED ORDER — MEASLES, MUMPS & RUBELLA VAC ~~LOC~~ INJ
0.5000 mL | INJECTION | Freq: Once | SUBCUTANEOUS | Status: DC
  Filled 2012-02-12: qty 0.5

## 2012-02-12 MED ORDER — BENZOCAINE-MENTHOL 20-0.5 % EX AERO: 1.0000 "application " | INHALATION_SPRAY | CUTANEOUS | Status: DC | PRN

## 2012-02-12 MED ORDER — LORATADINE 10 MG PO TABS
10.0000 mg | ORAL_TABLET | Freq: Every day | ORAL | Status: DC
  Administered 2012-02-12 – 2012-02-13 (×2): 10 mg via ORAL
  Filled 2012-02-12 (×3): qty 1

## 2012-02-12 NOTE — Progress Notes (Signed)
Patient was referred for history of depression/anxiety. * Referral screened out by Clinical Social Worker because none of the following criteria appear to apply: ~ History of anxiety/depression during this pregnancy, or of post-partum depression. ~ Diagnosis of anxiety and/or depression within last 3 years ~ History of depression due to pregnancy loss/loss of child OR * Patient's symptoms currently being treated with medication and/or therapy. Please contact the Clinical Social Worker if needs arise, or if patient requests.  PNR clearly states MOB is "doing well" off meds at this point and no concerns are stated.  CSW contacted bedside RN to discuss.  She has no concerns at this time, but will contact CSW if any issues arise.   

## 2012-02-12 NOTE — Anesthesia Postprocedure Evaluation (Signed)
  Anesthesia Post-op Note  Patient: ABBIEGAIL LANDGREN  Procedure(s) Performed: * No procedures listed *  Patient Location: PACU and Mother/Baby  Anesthesia Type:Epidural  Level of Consciousness: awake, alert  and oriented  Airway and Oxygen Therapy: Patient Spontanous Breathing  Post-op Pain: mild  Post-op Assessment: Patient's Cardiovascular Status Stable, Respiratory Function Stable, No signs of Nausea or vomiting, Adequate PO intake and Pain level controlled  Post-op Vital Signs: stable  Complications: No apparent anesthesia complications

## 2012-02-12 NOTE — Progress Notes (Signed)
Patient ID: Sherri Warren, female   DOB: 1990/04/12, 22 y.o.   MRN: [id number] #1 afebrile BP normal no complaints

## 2012-02-13 MED ORDER — OXYCODONE-ACETAMINOPHEN 5-325 MG PO TABS: 1.0000 | ORAL_TABLET | Freq: Four times a day (QID) | ORAL | Status: DC | PRN

## 2012-02-13 MED ORDER — IBUPROFEN 600 MG PO TABS: 600.0000 mg | ORAL_TABLET | Freq: Four times a day (QID) | ORAL | Status: DC | PRN

## 2012-02-13 NOTE — Discharge Summary (Signed)
NAMEGRACELAND, WACHTER NO.:  [id number]  MEDICAL RECORD NO.:  [id number]  LOCATION:  9119                          FACILITY:  [facility]  PHYSICIAN:  Malachi Pro. Ambrose Mantle, M.D. DATE OF BIRTH:  30-Nov-1990  DATE OF ADMISSION:  02/11/2012 DATE OF DISCHARGE:  02/13/2012                              DISCHARGE SUMMARY   This is a 22 year old black female, para 0-0-1-0, gravida 2, EDC February 12, 2012, who was admitted with contractions and cervical change.  Blood group and type B positive, negative antibody.  Pap smear normal. Rubella immune.  RPR nonreactive.  Urine culture negative.  Hepatitis B surface antigen negative, HIV negative, hemoglobin AA, GC and Chlamydia negative.  AFP negative.  First trimester screen negative.  One-hour Glucola 91.  The patient had a relatively benign prenatal course.  She was admitted with cervical change and contractions.  She did receive an epidural, had spontaneous rupture of membranes with meconium-stained fluid.  Progressed to full dilatation and pushed well, but had decelerations and fetal tachycardia.  They were treated with position change.  I had the NICU staff attend the delivery, a left mediolateral episiotomy was done but the baby did not deliver, so I placed the Mityvac vacuum with a Bell cup.  With 1 contraction and no pop offs, a living female infant was delivered ROP.  The infant was given to the NICU staff who assigned Apgars of 7 at 1 and 9 at 5 minutes.  Dr. Algernon Huxley did not feel a pH was necessary.  The placenta delivered intact.  Uterus normal.  Left mediolateral episiotomy repaired with 3-0 Vicryl.  Rectal was negative.  Blood loss about 500 mL.  Postpartum. the patient did quite well and was discharged on the second postpartum day.  Initial hemoglobin 12.7, hematocrit 39.6, white count 16,300, platelet count was 170.  Followup hemoglobin 10.2, RPR was nonreactive.  FINAL DIAGNOSES:  Intrauterine pregnancy at term, delivered  vertex, fetal heart rate decelerations and fetal tachycardia.  OPERATION:  Vacuum-assisted vaginal delivery.  FINAL CONDITION:  Improved.  Instructions include our regular discharge instruction booklet, the after visit summary, and prescriptions for Motrin 600 mg 30 tablets 1 every 6 hours as needed for pain and Percocet 5/325, 30 tablets 1 every 6 hours as needed for pain.     Malachi Pro. Ambrose Mantle, M.D.     TFH/MEDQ  D:  02/13/2012  T:  02/13/2012  Job:  [id number]

## 2012-02-13 NOTE — Progress Notes (Signed)
Patient ID: Sherri Warren, female   DOB: Mar 18, 1990, 22 y.o.   MRN: [id number] #[id number] afebrile BP normal for d/c

## 2012-02-16 NOTE — H&P (Signed)
NAME:  Sherri Warren, Sherri Warren NO.:  MEDICAL RECORD NO.:  [id number]  LOCATION:                                 FACILITY:  PHYSICIAN:  Malachi Pro. Ambrose Mantle, M.D. DATE OF BIRTH:  1990/09/28  DATE OF ADMISSION: DATE OF DISCHARGE:                             HISTORY & PHYSICAL   PRESENT ILLNESS:  This is a 22 year old black female, para 0-0-1-0, gravida 2, with [facility] February 12, 2012, by her last menstrual period admitted in early labor.  The patient came to the maternity admission unit and was observed and thought to progress from 3 to almost 4 cm dilatation with some bloody show and painful contractions.  Lab work; blood group and type B positive with a negative antibody.  Pap smear normal.  Rubella immune.  RPR nonreactive.  Urine culture negative. Hepatitis B surface antigen negative, HIV negative, GC and Chlamydia negative.  Hemoglobin AA,  AFP negative.  One-hour Glucola 91.  Group B strep positive.  I do not have the first trimester screen report.  The patient's prenatal course was uncomplicated.  She was scheduled for direct admission February 16, 2012, if she did go into labor.  At her last visit on February 10, 2012, her cervix was 2 cm, 70%, vertex, at a - 2.  PAST MEDICAL HISTORY:  Reveals she did have a therapeutic abortion in 2010.  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  SURGICAL HISTORY:  Therapeutic abortion in 2010.  MEDICATIONS:  Prenatal vitamins.  ALLERGY:  No known latex allergy.  She is allergic to sulfa.  OBSTETRIC HISTORY:  Therapeutic abortion at 7 weeks in April, 2009.  SOCIAL HISTORY:  Says that she went to college 4 years, 2 years [employer] and 2 years at [employer].  She denies tobacco, alcohol, and illicit substance abuse.  FAMILY HISTORY:  Her mother has arthritis.  Maternal  grandfather had diabetes and maternal grandmother, heart attack.  PHYSICAL EXAMINATION:  VITAL SIGNS:  On admission, patient's vital signs have not been recorded except for at 7:24 a.m. a blood pressure of 139/83 and pulse of 104. HEART:  Normal size and sounds.  No murmurs. LUNGS:  Clear to auscultation. ABDOMEN:  At her last prenatal visit, fundal height was 39 cm.  Fetal heart tones are normal.  She is contracting every 3 to 4 minutes.  Per the maternity admission nurse, her cervix is 3 to 4 cm, 80% to 90% effaced, vertex presentation.  ADMITTING IMPRESSION:  Intrauterine pregnancy at 40 weeks, active labor, positive group B strep.  The patient will be placed on penicillin and observed for progress in labor.  She requested and will receive an epidural.     Malachi Pro. Ambrose Mantle, M.D.     TFH/MEDQ  D:  02/11/2012  T:  02/11/2012  Job:  [id number]

## 2012-02-17 NOTE — Progress Notes (Signed)
Ur chart review completed.  

## 2012-07-22 ENCOUNTER — Encounter (HOSPITAL_COMMUNITY): Payer: Self-pay | Admitting: *Deleted

## 2012-07-22 ENCOUNTER — Emergency Department (HOSPITAL_COMMUNITY)
Admission: EM | Admit: 2012-07-22 | Discharge: 2012-07-22 | Disposition: A | Discharge status: Home / Self Care | Payer: Medicaid Other | Attending: Emergency Medicine | Admitting: Emergency Medicine

## 2012-07-22 DIAGNOSIS — Z8679 Personal history of other diseases of the circulatory system: Secondary | ICD-10-CM | POA: Insufficient documentation

## 2012-07-22 DIAGNOSIS — Z8669 Personal history of other diseases of the nervous system and sense organs: Secondary | ICD-10-CM | POA: Insufficient documentation

## 2012-07-22 DIAGNOSIS — Z8659 Personal history of other mental and behavioral disorders: Secondary | ICD-10-CM | POA: Insufficient documentation

## 2012-07-22 DIAGNOSIS — H53149 Visual discomfort, unspecified: Secondary | ICD-10-CM | POA: Insufficient documentation

## 2012-07-22 DIAGNOSIS — R51 Headache: Secondary | ICD-10-CM | POA: Insufficient documentation

## 2012-07-22 DIAGNOSIS — R519 Headache, unspecified: Secondary | ICD-10-CM

## 2012-07-22 MED ORDER — DIPHENHYDRAMINE HCL 50 MG/ML IJ SOLN
25.0000 mg | Freq: Once | INTRAMUSCULAR | Status: AC
  Administered 2012-07-22: 25 mg via INTRAVENOUS
  Filled 2012-07-22 (×2): qty 1

## 2012-07-22 MED ORDER — METOCLOPRAMIDE HCL 5 MG/ML IJ SOLN
10.0000 mg | Freq: Once | INTRAMUSCULAR | Status: AC
  Administered 2012-07-22: 10 mg via INTRAVENOUS
  Filled 2012-07-22 (×2): qty 2

## 2012-07-22 MED ORDER — SODIUM CHLORIDE 0.9 % IV SOLN: 1000.0000 mL | INTRAVENOUS | Status: DC

## 2012-07-22 MED ORDER — PROMETHAZINE HCL 25 MG PO TABS: 25.0000 mg | ORAL_TABLET | Freq: Three times a day (TID) | ORAL | Status: DC | PRN

## 2012-07-22 MED ORDER — SODIUM CHLORIDE 0.9 % IV SOLN
1000.0000 mL | Freq: Once | INTRAVENOUS | Status: AC
  Administered 2012-07-22: 1000 mL via INTRAVENOUS

## 2012-07-22 NOTE — ED Provider Notes (Signed)
History     CSN: [id number]  Arrival date & time 07/22/12  4098   First MD Initiated Contact with Patient 07/22/12 1815      Chief Complaint  Patient presents with  . Headache    (Consider location/radiation/quality/duration/timing/severity/associated sxs/prior treatment) HPI  patient reports she has a history of headaches however the last time she had a bad headache was about a year ago. She states her headache started last night and  indicates it was in the right temple area where it has persisted. She describes the pain as throbbing. She has had nausea and vomiting 3-4 times today. She denies any blurred vision, numbness or tingling of her extremities. She does describe photophobia and increased sensitivity to sound. She took Aleve earlier today without relief. She delivered a baby in January and was using depoprovera, but she was having bleeding so she has a Implanon now for about 6 weeks.  This headache is like prior headaches.   PCP [facility]  Past Medical History  Diagnosis Date  . Migraine   . Depression   . Myoclonic jerking     Past Surgical History  Procedure Laterality Date  . No past surgeries      Family History  Problem Relation Age of Onset  . Anesthesia problems Neg Hx   . Hypotension Neg Hx   . Malignant hyperthermia Neg Hx   . Pseudochol deficiency Neg Hx   . Other Neg Hx     History  Substance Use Topics  . Smoking status: Never Smoker   . Smokeless tobacco: Never Used  . Alcohol Use: No  lives at home Just graduated from college  OB History   Grav Para Term Preterm Abortions TAB SAB Ect Mult Living   2 1 1  1 1    1       Review of Systems  All other systems reviewed and are negative.    Allergies  Sulfa antibiotics  Home Medications   Current Outpatient Rx  Name  Route  Sig  Dispense  Refill  . etonogestrel (IMPLANON) 68 MG IMPL implant   Subcutaneous   Inject 1 each into the skin once.           BP  129/86  Pulse 72  Temp(Src) 98.6 F (37 C) (Oral)  Resp 16  Ht 5\' 7"  (1.702 m)  Wt 149 lb (67.586 kg)  BMI 23.33 kg/m2  SpO2 100%  LMP 07/08/2012  Breastfeeding? No  Vital signs normal    Physical Exam  Nursing note and vitals reviewed. Constitutional: She is oriented to person, place, and time. She appears well-developed and well-nourished.  Non-toxic appearance. She does not appear ill. No distress.  Appears to get jabs of pain off and on  HENT:  Head: Normocephalic and atraumatic.    Right Ear: External ear normal.  Left Ear: External ear normal.  Nose: Nose normal. No mucosal edema or rhinorrhea.  Mouth/Throat: Oropharynx is clear and moist and mucous membranes are normal. No dental abscesses or edematous.  Area of pain noted  Eyes: Conjunctivae and EOM are normal. Pupils are equal, round, and reactive to light.  Neck: Normal range of motion and full passive range of motion without pain. Neck supple.  Cardiovascular: Normal rate, regular rhythm and normal heart sounds.  Exam reveals no gallop and no friction rub.   No murmur heard. Pulmonary/Chest: Effort normal and breath sounds normal. No respiratory distress. She has no wheezes. She has no rhonchi.  She has no rales. She exhibits no tenderness and no crepitus.  Abdominal: Soft. Normal appearance and bowel sounds are normal. She exhibits no distension. There is no tenderness. There is no rebound and no guarding.  Musculoskeletal: Normal range of motion. She exhibits no edema and no tenderness.  Moves all extremities well.   Neurological: She is alert and oriented to person, place, and time. She has normal strength. No cranial nerve deficit.  Skin: Skin is warm, dry and intact. No rash noted. No erythema. No pallor.  Psychiatric: She has a normal mood and affect. Her speech is normal and behavior is normal. Her mood appears not anxious.    ED Course  Procedures (including critical care time) Medications  0.9 %  sodium  chloride infusion (1,000 mLs Intravenous New Bag/Given 07/22/12 1840)    Followed by  0.9 %  sodium chloride infusion (not administered)  metoCLOPramide (REGLAN) injection 10 mg (10 mg Intravenous Given 07/22/12 1840)  diphenhydrAMINE (BENADRYL) injection 25 mg (25 mg Intravenous Given 07/22/12 1840)     19:30 recheck, headache almost gone, feels ready to go home.   1. Headache     New Prescriptions   PROMETHAZINE (PHENERGAN) 25 MG TABLET    Take 1 tablet (25 mg total) by mouth every 8 (eight) hours as needed for nausea (or headache).    Plan discharge  Devoria Albe, MD, FACEP   MDM          Ward Givens, MD 07/22/12 [phone]

## 2012-07-22 NOTE — ED Notes (Signed)
Headache since yesterday.  Woke up vomiting this morning.  Took aleve earlier today with no relief.  Denies visual disturbances.  C/o light/sound sensitivity.

## 2013-04-02 ENCOUNTER — Emergency Department (HOSPITAL_COMMUNITY)
Admission: EM | Admit: 2013-04-02 | Discharge: 2013-04-02 | Disposition: A | Discharge status: Home / Self Care | Payer: Medicaid Other | Attending: Emergency Medicine | Admitting: Emergency Medicine

## 2013-04-02 ENCOUNTER — Encounter (HOSPITAL_COMMUNITY): Payer: Self-pay | Admitting: Emergency Medicine

## 2013-04-02 DIAGNOSIS — Z79899 Other long term (current) drug therapy: Secondary | ICD-10-CM | POA: Insufficient documentation

## 2013-04-02 DIAGNOSIS — Y929 Unspecified place or not applicable: Secondary | ICD-10-CM | POA: Insufficient documentation

## 2013-04-02 DIAGNOSIS — W268XXA Contact with other sharp object(s), not elsewhere classified, initial encounter: Secondary | ICD-10-CM | POA: Insufficient documentation

## 2013-04-02 DIAGNOSIS — Z8669 Personal history of other diseases of the nervous system and sense organs: Secondary | ICD-10-CM | POA: Insufficient documentation

## 2013-04-02 DIAGNOSIS — M795 Residual foreign body in soft tissue: Secondary | ICD-10-CM

## 2013-04-02 DIAGNOSIS — S2095XA Superficial foreign body of unspecified parts of thorax, initial encounter: Secondary | ICD-10-CM | POA: Insufficient documentation

## 2013-04-02 DIAGNOSIS — Z8679 Personal history of other diseases of the circulatory system: Secondary | ICD-10-CM | POA: Insufficient documentation

## 2013-04-02 DIAGNOSIS — F3289 Other specified depressive episodes: Secondary | ICD-10-CM | POA: Insufficient documentation

## 2013-04-02 DIAGNOSIS — R000 Tachycardia, unspecified: Secondary | ICD-10-CM | POA: Insufficient documentation

## 2013-04-02 DIAGNOSIS — Y939 Activity, unspecified: Secondary | ICD-10-CM | POA: Insufficient documentation

## 2013-04-02 DIAGNOSIS — F329 Major depressive disorder, single episode, unspecified: Secondary | ICD-10-CM | POA: Insufficient documentation

## 2013-04-02 DIAGNOSIS — Z792 Long term (current) use of antibiotics: Secondary | ICD-10-CM | POA: Insufficient documentation

## 2013-04-02 MED ORDER — LIDOCAINE HCL (PF) 1 % IJ SOLN
INTRAMUSCULAR | Status: AC
  Administered 2013-04-02: 21:00:00
  Filled 2013-04-02: qty 5

## 2013-04-02 NOTE — ED Notes (Signed)
Pt is having the MRI for eval of rapid heart rate, 120-140 Pulse at time of d/c  is110

## 2013-04-02 NOTE — ED Notes (Signed)
Pt has piercing lt breast and rt abd.  Want them removed to be able to have an MRI tomorrow at [facility]. To evaluate her heart.

## 2013-04-02 NOTE — ED Notes (Signed)
Patient states that she needs two body piercing removed. States that she is supposed to have an MRI tomorrow and they have to be out before she can have it done.

## 2013-04-04 NOTE — ED Provider Notes (Addendum)
CSN: [id number]     Arrival date & time 04/02/13  8299 History   First MD Initiated Contact with Patient 04/02/13 2006     Chief Complaint  Patient presents with  . Piercing removal      (Consider location/radiation/quality/duration/timing/severity/associated sxs/prior Treatment) HPI Comments: Sherri Warren is a 23 y.o. female who presents to the Emergency Department requesting removal of two microdermal body piercing's.  She states the piercing have been in place for approximately five years and she is scheduled to have a cardiac MRI tomorrow for evaluation of  "left heart weakness and rapid heart rate" and has been advised that the piercing will need to be removed.  She denies any problems with the piercings  The history is provided by the patient.    Past Medical History  Diagnosis Date  . Migraine   . Depression   . Myoclonic jerking    Past Surgical History  Procedure Laterality Date  . No past surgeries     Family History  Problem Relation Age of Onset  . Anesthesia problems Neg Hx   . Hypotension Neg Hx   . Malignant hyperthermia Neg Hx   . Pseudochol deficiency Neg Hx   . Other Neg Hx    History  Substance Use Topics  . Smoking status: Never Smoker   . Smokeless tobacco: Never Used  . Alcohol Use: No   OB History   Grav Para Term Preterm Abortions TAB SAB Ect Mult Living   2 1 1  1 1    1      Review of Systems  Constitutional: Negative for fever, chills and fatigue.  Respiratory: Negative for cough, chest tightness, shortness of breath and wheezing.   Cardiovascular: Negative for chest pain and palpitations.  Musculoskeletal: Negative for myalgias, neck pain and neck stiffness.  Skin: Negative for rash.  Hematological: Does not bruise/bleed easily.  All other systems reviewed and are negative.      Allergies  Sulfa antibiotics  Home Medications   Current Outpatient Rx  Name  Route  Sig  Dispense  Refill  . amitriptyline (ELAVIL) 50 MG tablet    Oral   Take 50 mg by mouth at bedtime.         . minocycline (MINOCIN,DYNACIN) 75 MG capsule   Oral   Take 75 mg by mouth 2 (two) times daily.         . norgestrel-ethinyl estradiol (CRYSELLE-28) 0.3-30 MG-MCG tablet   Oral   Take 1 tablet by mouth daily.          BP 131/71  Pulse 110  Temp(Src) 98.8 F (37.1 C) (Oral)  Resp 24  Ht 5\' 7"  (1.702 m)  Wt 183 lb (83.008 kg)  BMI 28.66 kg/m2  SpO2 100%  LMP 03/26/2013  Breastfeeding? No Physical Exam  Nursing note and vitals reviewed. Constitutional: She is oriented to person, place, and time. She appears well-developed and well-nourished. No distress.  HENT:  Head: Normocephalic and atraumatic.  Neck: Normal range of motion. Neck supple.  Cardiovascular: Regular rhythm and intact distal pulses.  Tachycardia present.  Exam reveals no friction rub.   No murmur heard. Pulmonary/Chest: Effort normal and breath sounds normal. No respiratory distress. She exhibits no tenderness.  Musculoskeletal: Normal range of motion. She exhibits no edema.  Neurological: She is alert and oriented to person, place, and time. She exhibits normal muscle tone. Coordination normal.  Skin: Skin is warm and dry.  microdermal piercing to the upper left breast  and lower right abdominal wall.  No edema , drainage or surrounding erythema     ED Course  FOREIGN BODY REMOVAL Date/Time: 04/02/2013 9:30 PM Performed by: Vanessa Asbury, Andriy Sherk L. Authorized by: Hale Bogus Consent: Verbal consent obtained. written consent not obtained. Risks and benefits: risks, benefits and alternatives were discussed Consent given by: patient Patient understanding: patient states understanding of the procedure being performed Imaging studies: imaging studies not available Patient identity confirmed: verbally with patient and arm band Time out: Immediately prior to procedure a "time out" was called to verify the correct patient, procedure, equipment, support staff and  site/side marked as required. Body area: skin General location: trunk Location details: left breast Anesthesia: local infiltration Local anesthetic: lidocaine 1% without epinephrine Anesthetic total: 1 ml Patient sedated: no Patient restrained: no Patient cooperative: yes Localization method: visualized Removal mechanism: scalpel and hemostat Dressing: steri-strip. Tendon involvement: none Depth: subcutaneous Complexity: simple 1 objects recovered. Objects recovered: one body piercing Post-procedure assessment: foreign body removed Patient tolerance: Patient tolerated the procedure well with no immediate complications.  piercing removed by making a small incision of the skin with a #15 scalpel and removed with a  hemostat Foreign body removal #2 Performed by: Francisco Eyerly L. Authorized by: Hale Bogus Consent: Verbal consent obtained. Risks and benefits: risks, benefits and alternatives were discussed Consent given by: patient Patient identity confirmed: provided demographic data Prepped and Draped in normal sterile fashion Wound explored  Location: right lower abdominal wall  One body piercing, visualized, removed using #15 scalpel and made a small incision to the skin, and removed with hemostat  Completely removed  steri-strip dressing applied subcutaneous depth  removed completely  Anesthesia: local infiltration  Local anesthetic: lidocaine 1% w/o epinephrine  Anesthetic total: 63ml  Irrigation method: syringe Amount of cleaning: standard  Patient tolerance: Patient tolerated the procedure well with no immediate complications.       (including critical care time) Labs Review Labs Reviewed - No data to display Imaging Review No results found.   EKG Interpretation None        MDM   Final diagnoses:  Foreign body in soft tissue    Pt tolerated procedure.  Bleeding controlled.  No immediate complications.  Appears stable for discharge.     Pt is tachycardic, but is being evaluated by her PMD for this and scheduled to have cardiac MRI on 04/03/13    Mallie Linnemann L. Cyruss Arata, PA-C 04/04/13 1408  Lolly Glaus L. Vanessa [place], PA-C 04/11/13 3748

## 2013-04-05 NOTE — ED Provider Notes (Signed)
Medical screening examination/treatment/procedure(s) were performed by non-physician practitioner and as supervising physician I was immediately available for consultation/collaboration.   Babette Relic, MD 04/05/13 2027

## 2013-04-20 NOTE — ED Provider Notes (Signed)
Medical screening examination/treatment/procedure(s) were performed by non-physician practitioner and as supervising physician I was immediately available for consultation/collaboration.   Babette Relic, MD 04/20/13 1006

## 2013-11-04 ENCOUNTER — Emergency Department (HOSPITAL_COMMUNITY)
Admission: EM | Admit: 2013-11-04 | Discharge: 2013-11-04 | Disposition: A | Discharge status: Home / Self Care | Payer: Medicaid Other | Attending: Emergency Medicine | Admitting: Emergency Medicine

## 2013-11-04 ENCOUNTER — Encounter (HOSPITAL_COMMUNITY): Payer: Self-pay | Admitting: Emergency Medicine

## 2013-11-04 DIAGNOSIS — Z792 Long term (current) use of antibiotics: Secondary | ICD-10-CM | POA: Insufficient documentation

## 2013-11-04 DIAGNOSIS — M67431 Ganglion, right wrist: Secondary | ICD-10-CM

## 2013-11-04 DIAGNOSIS — M25531 Pain in right wrist: Secondary | ICD-10-CM | POA: Diagnosis present

## 2013-11-04 DIAGNOSIS — Z79899 Other long term (current) drug therapy: Secondary | ICD-10-CM | POA: Insufficient documentation

## 2013-11-04 DIAGNOSIS — F329 Major depressive disorder, single episode, unspecified: Secondary | ICD-10-CM | POA: Insufficient documentation

## 2013-11-04 DIAGNOSIS — Z8669 Personal history of other diseases of the nervous system and sense organs: Secondary | ICD-10-CM | POA: Diagnosis not present

## 2013-11-04 DIAGNOSIS — G43909 Migraine, unspecified, not intractable, without status migrainosus: Secondary | ICD-10-CM | POA: Diagnosis not present

## 2013-11-04 MED ORDER — DICLOFENAC SODIUM 75 MG PO TBEC: 75.0000 mg | DELAYED_RELEASE_TABLET | Freq: Two times a day (BID) | ORAL | Status: DC

## 2013-11-04 NOTE — ED Provider Notes (Signed)
CSN: [id number]     Arrival date & time 11/04/13  1038 History  This chart was scribed for a non-physician practitioner, Hale Bogus, PA-C working with Mariea Clonts, MD by Martinique Peace, ED Scribe. The patient was seen in [facility]/AP[facility]. The patient's care was started at 11:52 AM.     Chief Complaint  Patient presents with  . Wrist Pain      Patient is a 23 y.o. female presenting with wrist pain. The history is provided by the patient. No language interpreter was used.  Wrist Pain This is a chronic problem. The current episode started more than 1 week ago.  HPI Comments: Sherri Warren is a 23 y.o. female who presents to the Emergency Department complaining of atraumatic chronic right wrist pain over the course of past year but reports recent flare-up in past week. Pt states went to the urgent care in the past and was diagnosed with a ganglion cyst to affected area. She adds that she has a 23 year old and picks him up frequently. Pt notes that she was given Naproxen to address pain but denies any relief. She also denies swelling, numbness or pain above her wrist.     Past Medical History  Diagnosis Date  . Migraine   . Depression   . Myoclonic jerking    Past Surgical History  Procedure Laterality Date  . No past surgeries     Family History  Problem Relation Age of Onset  . Anesthesia problems Neg Hx   . Hypotension Neg Hx   . Malignant hyperthermia Neg Hx   . Pseudochol deficiency Neg Hx   . Other Neg Hx    History  Substance Use Topics  . Smoking status: Never Smoker   . Smokeless tobacco: Never Used  . Alcohol Use: No   OB History   Grav Para Term Preterm Abortions TAB SAB Ect Mult Living   2 1 1  1 1    1      Review of Systems  Constitutional: Negative for fever and chills.  Gastrointestinal: Negative for nausea, vomiting and diarrhea.  Genitourinary: Negative for dysuria and difficulty urinating.  Musculoskeletal: Positive for arthralgias and joint  swelling.       Right wrist pain.   Skin: Negative for color change and wound.  All other systems reviewed and are negative.     Allergies  Sulfa antibiotics  Home Medications   Prior to Admission medications   Medication Sig Start Date End Date Taking? Authorizing Provider  metoprolol succinate (TOPROL-XL) 25 MG 24 hr tablet Take 25 mg by mouth daily.   Yes Historical Provider, MD  amitriptyline (ELAVIL) 50 MG tablet Take 50 mg by mouth at bedtime.    Historical Provider, MD  minocycline (MINOCIN,DYNACIN) 75 MG capsule Take 75 mg by mouth 2 (two) times daily.    Historical Provider, MD  norgestrel-ethinyl estradiol (CRYSELLE-28) 0.3-30 MG-MCG tablet Take 1 tablet by mouth daily.    Historical Provider, MD   BP 122/76  Pulse 105  Temp(Src) 98.2 F (36.8 C) (Oral)  Resp 16  Ht 5\' 7"  (1.702 m)  Wt 210 lb (95.255 kg)  BMI 32.88 kg/m2  SpO2 100%  LMP 11/02/2013 Physical Exam  Nursing note and vitals reviewed. Constitutional: She is oriented to person, place, and time. She appears well-developed and well-nourished. No distress.  HENT:  Head: Normocephalic and atraumatic.  Eyes: Conjunctivae and EOM are normal.  Neck: Neck supple. No tracheal deviation present.  Cardiovascular:  Normal rate, regular rhythm, normal heart sounds and intact distal pulses.  Exam reveals no gallop and no friction rub.   No murmur heard. Pulmonary/Chest: Effort normal. No respiratory distress.  Musculoskeletal: Normal range of motion. She exhibits tenderness.  Localized tenderness to distal right wrist. 1cm nodule present. No erythema or surrounding edema. Radial pulse and distal sensation intact.   Neurological: She is alert and oriented to person, place, and time.  Skin: Skin is warm and dry.  Psychiatric: She has a normal mood and affect. Her behavior is normal.    ED Course  Procedures (including critical care time) Labs Review Labs Reviewed - No data to display  No results  found.   Imaging Review No results found.   EKG Interpretation None     Medications - No data to display  11:52 AM- Treatment plan was discussed with patient who verbalizes understanding and agrees.   MDM   Final diagnoses:  Ganglion cyst of wrist, right    Pt with a flesh colored, palpable nodule to the distal right wrist.  No clinical concerns for infectious process.   Wrist splint applied for comfort.  Remains NV intact.  Pt given referral info for orthopedics and agrees to arrange f/u.  Rx for diclofenac.     I personally performed the services described in this documentation, which was scribed in my presence. The recorded information has been reviewed and is accurate.   Charell Faulk L. Vanessa [place], PA-C 11/05/13 2028

## 2013-11-04 NOTE — ED Notes (Signed)
Patient with no complaints at this time. Respirations even and unlabored. Skin warm/dry. Discharge instructions reviewed with patient at this time. Patient given opportunity to voice concerns/ask questions. Patient discharged at this time and left Emergency Department with steady gait.   

## 2013-11-04 NOTE — Discharge Instructions (Signed)

## 2013-11-04 NOTE — ED Notes (Signed)
Pt here with right wrist pain, not related to trauma.  Pt states that she thinks it is a ganglion cyst.

## 2013-11-07 NOTE — ED Provider Notes (Signed)
Medical screening examination/treatment/procedure(s) were performed by non-physician practitioner and as supervising physician I was immediately available for consultation/collaboration.   EKG Interpretation None        Mariea Clonts, MD 11/07/13 1609

## 2013-12-03 ENCOUNTER — Encounter (HOSPITAL_COMMUNITY): Payer: Self-pay | Admitting: Emergency Medicine

## 2015-01-09 ENCOUNTER — Encounter (HOSPITAL_COMMUNITY): Payer: Self-pay

## 2015-01-09 ENCOUNTER — Inpatient Hospital Stay (HOSPITAL_COMMUNITY)
Admission: AD | Admit: 2015-01-09 | Discharge: 2015-01-09 | Disposition: A | Discharge status: Home / Self Care | Payer: Medicaid Other | Source: Ambulatory Visit | Attending: Obstetrics and Gynecology | Admitting: Obstetrics and Gynecology

## 2015-01-09 ENCOUNTER — Inpatient Hospital Stay (HOSPITAL_COMMUNITY): Payer: Medicaid Other

## 2015-01-09 DIAGNOSIS — B9689 Other specified bacterial agents as the cause of diseases classified elsewhere: Secondary | ICD-10-CM

## 2015-01-09 DIAGNOSIS — O200 Threatened abortion: Secondary | ICD-10-CM | POA: Diagnosis not present

## 2015-01-09 DIAGNOSIS — O209 Hemorrhage in early pregnancy, unspecified: Secondary | ICD-10-CM

## 2015-01-09 DIAGNOSIS — Z3A01 Less than 8 weeks gestation of pregnancy: Secondary | ICD-10-CM | POA: Diagnosis not present

## 2015-01-09 DIAGNOSIS — N760 Acute vaginitis: Secondary | ICD-10-CM | POA: Diagnosis not present

## 2015-01-09 DIAGNOSIS — O23591 Infection of other part of genital tract in pregnancy, first trimester: Secondary | ICD-10-CM | POA: Diagnosis not present

## 2015-01-09 LAB — CBC
HCT: 42.3 % (ref 36.0–46.0)
HEMOGLOBIN: 14.4 g/dL (ref 12.0–15.0)
MCH: 31.3 pg (ref 26.0–34.0)
MCHC: 34 g/dL (ref 30.0–36.0)
MCV: 92 fL (ref 78.0–100.0)
Platelets: 220 10*3/uL (ref 150–400)
RBC: 4.6 MIL/uL (ref 3.87–5.11)
RDW: 14.3 % (ref 11.5–15.5)
WBC: 11 10*3/uL — AB (ref 4.0–10.5)

## 2015-01-09 LAB — URINE MICROSCOPIC-ADD ON

## 2015-01-09 LAB — URINALYSIS, ROUTINE W REFLEX MICROSCOPIC
GLUCOSE, UA: NEGATIVE mg/dL
KETONES UR: 15 mg/dL — AB
NITRITE: NEGATIVE
Protein, ur: 30 mg/dL — AB
SPECIFIC GRAVITY, URINE: 1.015 (ref 1.005–1.030)
pH: 6.5 (ref 5.0–8.0)

## 2015-01-09 LAB — WET PREP, GENITAL
Sperm: NONE SEEN
Trich, Wet Prep: NONE SEEN
YEAST WET PREP: NONE SEEN

## 2015-01-09 LAB — POCT PREGNANCY, URINE: Preg Test, Ur: POSITIVE — AB

## 2015-01-09 LAB — HCG, QUANTITATIVE, PREGNANCY: HCG, BETA CHAIN, QUANT, S: 4478 m[IU]/mL — AB (ref <5)

## 2015-01-09 MED ORDER — METRONIDAZOLE 500 MG PO TABS: 500.0000 mg | ORAL_TABLET | Freq: Two times a day (BID) | ORAL | Status: DC

## 2015-01-09 NOTE — Discharge Instructions (Signed)

## 2015-01-09 NOTE — MAU Note (Signed)
Pt reports she started bleeding last night. Spotting when wiping. A little heavier today.

## 2015-01-09 NOTE — MAU Provider Note (Signed)
History     CSN: [id number]  Arrival date and time: 01/09/15 1643   First Provider Initiated Contact with Patient 01/09/15 1841      Chief Complaint  Patient presents with  . Vaginal Bleeding   HPI   [name] is a 24 y.o. female G3P1011 at [redacted]w[redacted]d presenting to MAU with vaginal bleeding. The bleeding started 3 days ago and has progressively gotten worse. She is having to wear a pad due to the bleeding. The blood is brown in color.   She denies pain at this time.   She has a history of major depressive disorder and sees [facility] for [facility]. She has stopped all of her depression medication and has an appointment scheduled for 12/13 to discuss safe options.   OB History    Gravida Para Term Preterm AB TAB SAB Ectopic Multiple Living   3 1 1  1 1    1       Past Medical History  Diagnosis Date  . Migraine   . Depression   . Myoclonic jerking     Past Surgical History  Procedure Laterality Date  . No past surgeries      Family History  Problem Relation Age of Onset  . Anesthesia problems Neg Hx   . Hypotension Neg Hx   . Malignant hyperthermia Neg Hx   . Pseudochol deficiency Neg Hx   . Other Neg Hx   . Hypertension Mother   . Heart disease Maternal Grandmother   . Diabetes Maternal Grandfather     Social History  Substance Use Topics  . Smoking status: Never Smoker   . Smokeless tobacco: Never Used  . Alcohol Use: No    Allergies:  Allergies  Allergen Reactions  . Sulfa Antibiotics Rash    Prescriptions prior to admission  Medication Sig Dispense Refill Last Dose  . doxylamine, Sleep, (UNISOM) 25 MG tablet Take 25 mg by mouth 2 (two) times daily as needed (For nausea.).    01/08/2015 at Unknown time  . metroNIDAZOLE (FLAGYL) 250 MG tablet Take 250 mg by mouth 3 (three) times daily.   01/09/2015 at Unknown time  . Prenatal Vit-Fe Fumarate-FA (PRENATAL MULTIVITAMIN) TABS tablet Take 1 tablet by mouth at bedtime.   01/08/2015 at Unknown time  .  vitamin B-6 (PYRIDOXINE) 25 MG tablet Take 25 mg by mouth 2 (two) times daily as needed (For nausea.).    01/08/2015 at Unknown time   Results for orders placed or performed during the hospital encounter of 01/09/15 (from the past 48 hour(s))  Urinalysis, Routine w reflex microscopic (not at [facility])     Status: Abnormal   Collection Time: 01/09/15  5:10 PM  Result Value Ref Range   Color, Urine AMBER (A) YELLOW    Comment: BIOCHEMICALS MAY BE AFFECTED BY COLOR   APPearance HAZY (A) CLEAR   Specific Gravity, Urine 1.015 1.005 - 1.030   pH 6.5 5.0 - 8.0   Glucose, UA NEGATIVE NEGATIVE mg/dL   Hgb urine dipstick LARGE (A) NEGATIVE   Bilirubin Urine MODERATE (A) NEGATIVE   Ketones, ur 15 (A) NEGATIVE mg/dL   Protein, ur 30 (A) NEGATIVE mg/dL   Nitrite NEGATIVE NEGATIVE   Leukocytes, UA MODERATE (A) NEGATIVE  Urine microscopic-add on     Status: Abnormal   Collection Time: 01/09/15  5:10 PM  Result Value Ref Range   Squamous Epithelial / LPF 6-30 (A) NONE SEEN   WBC, UA 6-30 0 - 5 WBC/hpf   RBC /  HPF 0-5 0 - 5 RBC/hpf   Bacteria, UA MANY (A) NONE SEEN   Urine-Other MUCOUS PRESENT   Pregnancy, urine POC     Status: Abnormal   Collection Time: 01/09/15  5:37 PM  Result Value Ref Range   Preg Test, Ur POSITIVE (A) NEGATIVE    Comment:        THE SENSITIVITY OF THIS METHODOLOGY IS >24 mIU/mL   CBC     Status: Abnormal   Collection Time: 01/09/15  6:25 PM  Result Value Ref Range   WBC 11.0 (H) 4.0 - 10.5 K/uL   RBC 4.60 3.87 - 5.11 MIL/uL   Hemoglobin 14.4 12.0 - 15.0 g/dL   HCT 42.3 36.0 - 46.0 %   MCV 92.0 78.0 - 100.0 fL   MCH 31.3 26.0 - 34.0 pg   MCHC 34.0 30.0 - 36.0 g/dL   RDW 14.3 11.5 - 15.5 %   Platelets 220 150 - 400 K/uL  hCG, quantitative, pregnancy     Status: Abnormal   Collection Time: 01/09/15  6:25 PM  Result Value Ref Range   hCG, Beta Chain, Quant, S 4478 (H) <5 mIU/mL    Comment:          GEST. AGE      CONC.  (mIU/mL)   <=1 WEEK        5 - 50     2 WEEKS        50 - 500     3 WEEKS       100 - 10,000     4 WEEKS     1,000 - 30,000     5 WEEKS     3,500 - 115,000   6-8 WEEKS     12,000 - 270,000    12 WEEKS     15,000 - 220,000        FEMALE AND NON-PREGNANT FEMALE:     LESS THAN 5 mIU/mL   Wet prep, genital     Status: Abnormal   Collection Time: 01/09/15  6:55 PM  Result Value Ref Range   Yeast Wet Prep HPF POC NONE SEEN NONE SEEN   Trich, Wet Prep NONE SEEN NONE SEEN   Clue Cells Wet Prep HPF POC PRESENT (A) NONE SEEN   WBC, Wet Prep HPF POC MANY (A) NONE SEEN    Comment: BACTERIA- TOO NUMEROUS TO COUNT   Sperm NONE SEEN       US Ob Comp Less 14 Wks  01/09/2015  CLINICAL DATA:  First trimester pregnancy with vaginal bleeding for 3 days. LMP 11/13/2014. EXAM: OBSTETRIC <14 WK [place] AND TRANSVAGINAL OB US TECHNIQUE: Both transabdominal and transvaginal ultrasound examinations were performed for complete evaluation of the gestation as well as the maternal uterus, adnexal regions, and pelvic cul-de-sac. Transvaginal technique was performed to assess early pregnancy. COMPARISON:  None. FINDINGS: Intrauterine gestational sac: Visualized/normal in shape. Yolk sac:  Visualized. Embryo: There is a probable tiny embryo, not well visualized due to small size. Cardiac Activity: Probably present, difficult to confirm given small size of embryo and measured rate. Heart Rate: 88  Bpm CRL: 1.8 mm; outside range but approximately 5 w 3 d; [place] EDC: 09/08/2015 Maternal uterus/adnexae: There is no significant subchorionic hematoma. Both maternal ovaries are visualized. No adnexal mass or significant free pelvic fluid. IMPRESSION: 1. Single intrauterine gestational sac with probable tiny embryo demonstrated fetal cardiac motion. Evaluation of the fetus is limited by early gestation. Recommend follow-up quantitative B-HCG levels and  follow-up US in 14 days to confirm and assess viability. This recommendation follows SRU consensus guidelines: Diagnostic Criteria for  Nonviable Pregnancy Early in the First Trimester. [place] Med [id number]. 2. No acute findings identified. Electronically Signed   By: Richardean Sale M.D.   On: 01/09/2015 19:28   US Ob Transvaginal  01/09/2015  CLINICAL DATA:  First trimester pregnancy with vaginal bleeding for 3 days. LMP 11/13/2014. EXAM: OBSTETRIC <14 WK [place] AND TRANSVAGINAL OB US TECHNIQUE: Both transabdominal and transvaginal ultrasound examinations were performed for complete evaluation of the gestation as well as the maternal uterus, adnexal regions, and pelvic cul-de-sac. Transvaginal technique was performed to assess early pregnancy. COMPARISON:  None. FINDINGS: Intrauterine gestational sac: Visualized/normal in shape. Yolk sac:  Visualized. Embryo: There is a probable tiny embryo, not well visualized due to small size. Cardiac Activity: Probably present, difficult to confirm given small size of embryo and measured rate. Heart Rate: 88  Bpm CRL: 1.8 mm; outside range but approximately 5 w 3 d; [place] EDC: 09/08/2015 Maternal uterus/adnexae: There is no significant subchorionic hematoma. Both maternal ovaries are visualized. No adnexal mass or significant free pelvic fluid. IMPRESSION: 1. Single intrauterine gestational sac with probable tiny embryo demonstrated fetal cardiac motion. Evaluation of the fetus is limited by early gestation. Recommend follow-up quantitative B-HCG levels and follow-up US in 14 days to confirm and assess viability. This recommendation follows SRU consensus guidelines: Diagnostic Criteria for Nonviable Pregnancy Early in the First Trimester. [place] [employer] Med [id number]. 2. No acute findings identified. Electronically Signed   By: Richardean Sale M.D.   On: 01/09/2015 19:28    Review of Systems  Constitutional: Negative for fever and chills.  Gastrointestinal: Negative for nausea, vomiting and abdominal pain.  Genitourinary: Negative for dysuria, urgency, frequency, hematuria and flank pain.   Psychiatric/Behavioral: Positive for depression. Negative for suicidal ideas, hallucinations, memory loss and substance abuse. The patient is not nervous/anxious and does not have insomnia.    Physical Exam   Blood pressure 143/87, pulse 102, temperature 98.3 F (36.8 C), temperature source Oral, resp. rate 18, height 5\' 7"  (1.702 m), weight 154 lb (69.854 kg), last menstrual period 11/13/2014.  Physical Exam  Constitutional: She is oriented to person, place, and time. She appears well-developed and well-nourished. No distress.  HENT:  Head: Normocephalic.  Eyes: Pupils are equal, round, and reactive to light.  Neck: Neck supple.  GI: Soft. She exhibits no distension. There is no tenderness. There is no rebound.  Genitourinary:  Speculum exam: Vagina - Small-Moderate amount of creamy, brown discharge.  Cervix - No contact bleeding, no active bleeding Bimanual exam: Cervix closed, no CMT  Uterus non tender, slightly enlarged  Adnexa non tender, no masses bilaterally GC/Chlam, wet prep done Chaperone present for exam.  Musculoskeletal: Normal range of motion.  Neurological: She is alert and oriented to person, place, and time.  Skin: Skin is warm. She is not diaphoretic.  Psychiatric: Her behavior is normal.    MAU Course  Procedures  None  MDM  ABO: B positive blood type.  [place] Quant.  Urine culture pending   Assessment and Plan   A:  1. Threatened miscarriage   2. Vaginal bleeding in pregnancy, first trimester   3. BV (bacterial vaginosis)    P:  Discharge home in stable condition Return to MAU if bleeding worsens RX: Flagyl Follow [facility] with [facility] as scheduled Start OB care ASAP Prenatal vitamins daily Pelvic rest    Anderson Malta  I Nashay Brickley, NP 01/09/2015 7:46 PM

## 2015-01-10 LAB — HIV ANTIBODY (ROUTINE TESTING W REFLEX): HIV Screen 4th Generation wRfx: NONREACTIVE

## 2015-01-11 LAB — URINE CULTURE: Special Requests: NORMAL

## 2015-01-12 ENCOUNTER — Other Ambulatory Visit: Payer: Self-pay | Admitting: Nurse Practitioner

## 2015-01-12 MED ORDER — AMOXICILLIN 500 MG PO CAPS: 500.0000 mg | ORAL_CAPSULE | Freq: Three times a day (TID) | ORAL | Status: DC

## 2015-01-12 NOTE — Progress Notes (Signed)
Urine culture results reviewed.  +GBS noted on culture.  Will eprescribe Amoxicillin 500 mg PO TID x 7 days and notify patient.

## 2015-01-13 ENCOUNTER — Inpatient Hospital Stay (HOSPITAL_COMMUNITY)
Admission: AD | Admit: 2015-01-13 | Discharge: 2015-01-13 | Disposition: A | Discharge status: Home / Self Care | Payer: Medicaid Other | Source: Ambulatory Visit | Attending: Family Medicine | Admitting: Family Medicine

## 2015-01-13 ENCOUNTER — Encounter (HOSPITAL_COMMUNITY): Payer: Self-pay | Admitting: *Deleted

## 2015-01-13 ENCOUNTER — Telehealth: Payer: Self-pay | Admitting: Obstetrics and Gynecology

## 2015-01-13 DIAGNOSIS — O2341 Unspecified infection of urinary tract in pregnancy, first trimester: Secondary | ICD-10-CM | POA: Diagnosis not present

## 2015-01-13 DIAGNOSIS — Z3A01 Less than 8 weeks gestation of pregnancy: Secondary | ICD-10-CM | POA: Diagnosis not present

## 2015-01-13 DIAGNOSIS — N390 Urinary tract infection, site not specified: Secondary | ICD-10-CM

## 2015-01-13 DIAGNOSIS — O209 Hemorrhage in early pregnancy, unspecified: Secondary | ICD-10-CM | POA: Diagnosis present

## 2015-01-13 HISTORY — DX: Unspecified infectious disease: B99.9

## 2015-01-13 LAB — URINALYSIS, ROUTINE W REFLEX MICROSCOPIC
Glucose, UA: NEGATIVE mg/dL
Ketones, ur: 40 mg/dL — AB
Nitrite: POSITIVE — AB
PROTEIN: 100 mg/dL — AB
Specific Gravity, Urine: >1.03 — ABNORMAL HIGH (ref 1.005–1.030)
pH: 6.5 (ref 5.0–8.0)

## 2015-01-13 LAB — URINE MICROSCOPIC-ADD ON

## 2015-01-13 MED ORDER — CEFAZOLIN SODIUM-DEXTROSE 2-3 GM-% IV SOLR
2.0000 g | Freq: Three times a day (TID) | INTRAVENOUS | Status: DC
  Administered 2015-01-13: 2 g via INTRAVENOUS
  Filled 2015-01-13: qty 50

## 2015-01-13 MED ORDER — LACTATED RINGERS IV BOLUS (SEPSIS)
1000.0000 mL | Freq: Once | INTRAVENOUS | Status: AC
  Administered 2015-01-13: 1000 mL via INTRAVENOUS

## 2015-01-13 MED ORDER — CEPHALEXIN 500 MG PO CAPS: 500.0000 mg | ORAL_CAPSULE | Freq: Four times a day (QID) | ORAL | Status: DC

## 2015-01-13 NOTE — MAU Provider Note (Signed)
History     CSN: [id number]  Arrival date and time: 01/13/15 1713   First Provider Initiated Contact with Patient 01/13/15 1900      Chief Complaint  Patient presents with  . Vaginal Bleeding  . Abdominal Cramping  . Threatened Miscarriage   HPI   [name] is a 24 y.o. female G71P1011 at [redacted]w[redacted]d presenting with continued vaginal bleeding, and abdominal cramping. She was seen on 12/8 with complaints of vaginal bleeding; US showed an IUP with yolk sac. She is worried because she has continued to have vaginal bleeding. The bleeding is about the same, however she has seen a few small clots.    Her abdominal pain is located on both sides of her lower abdomen and middle. It is cramp like pain that comes and goes.   She has been taking amoxicillin as directed for 2 days now. Her urine culture came back positive for GBS >70,000  Denies back pain  OB History    Gravida Para Term Preterm AB TAB SAB Ectopic Multiple Living   3 1 1  1 1    1       Past Medical History  Diagnosis Date  . Migraine   . Myoclonic jerking   . Infection     UTI  . Depression     doing ok    Past Surgical History  Procedure Laterality Date  . No past surgeries      Family History  Problem Relation Age of Onset  . Anesthesia problems Neg Hx   . Hypotension Neg Hx   . Malignant hyperthermia Neg Hx   . Pseudochol deficiency Neg Hx   . Other Neg Hx   . Hypertension Mother   . Heart disease Maternal Grandmother   . Hypertension Maternal Grandmother   . Diabetes Maternal Grandfather     Social History  Substance Use Topics  . Smoking status: Never Smoker   . Smokeless tobacco: Never Used  . Alcohol Use: No    Allergies:  Allergies  Allergen Reactions  . Sulfa Antibiotics Rash    Prescriptions prior to admission  Medication Sig Dispense Refill Last Dose  . amoxicillin (AMOXIL) 500 MG capsule Take 1 capsule (500 mg total) by mouth 3 (three) times daily. 21 capsule 0   .  metroNIDAZOLE (FLAGYL) 500 MG tablet Take 1 tablet (500 mg total) by mouth 2 (two) times daily. 14 tablet 0   . Prenatal Vit-Fe Fumarate-FA (PRENATAL MULTIVITAMIN) TABS tablet Take 1 tablet by mouth at bedtime.   01/08/2015 at Unknown time  . vitamin B-6 (PYRIDOXINE) 25 MG tablet Take 25 mg by mouth 2 (two) times daily as needed (For nausea.).    01/08/2015 at Unknown time   Results for orders placed or performed during the hospital encounter of 01/13/15 (from the past 48 hour(s))  Urinalysis, Routine w reflex microscopic (not at [facility])     Status: Abnormal   Collection Time: 01/13/15  6:30 PM  Result Value Ref Range   Color, Urine YELLOW YELLOW   APPearance CLOUDY (A) CLEAR   Specific Gravity, Urine >1.030 (H) 1.005 - 1.030   pH 6.5 5.0 - 8.0   Glucose, UA NEGATIVE NEGATIVE mg/dL   Hgb urine dipstick LARGE (A) NEGATIVE   Bilirubin Urine MODERATE (A) NEGATIVE   Ketones, ur 40 (A) NEGATIVE mg/dL   Protein, ur 100 (A) NEGATIVE mg/dL   Nitrite POSITIVE (A) NEGATIVE   Leukocytes, UA SMALL (A) NEGATIVE  Urine microscopic-add on  Status: Abnormal   Collection Time: 01/13/15  6:30 PM  Result Value Ref Range   Squamous Epithelial / LPF 6-30 (A) NONE SEEN   WBC, UA 6-30 0 - 5 WBC/hpf   RBC / HPF TOO NUMEROUS TO COUNT 0 - 5 RBC/hpf   Bacteria, UA RARE (A) NONE SEEN   Urine-Other MUCOUS PRESENT     Review of Systems  Constitutional: Negative for fever and chills.  Gastrointestinal: Positive for abdominal pain. Negative for nausea and vomiting.  Genitourinary: Positive for dysuria. Negative for urgency, frequency and flank pain.  Musculoskeletal: Negative for back pain.   Physical Exam   Blood pressure 109/73, pulse 83, temperature 99.2 F (37.3 C), temperature source Oral, resp. rate 18, weight 151 lb 6.4 oz (68.675 kg), last menstrual period 11/13/2014.  Physical Exam  Constitutional: She is oriented to person, place, and time. She appears well-developed and well-nourished. No  distress.  HENT:  Head: Normocephalic.  Eyes: Pupils are equal, round, and reactive to light.  Respiratory: Effort normal.  GI: Soft. Normal appearance. She exhibits no distension. There is no tenderness. There is no rebound, no guarding and no CVA tenderness.  Genitourinary:  Cervix: closed, posterior. Small amount of dark red blood noted on exam glove.   Musculoskeletal: Normal range of motion.  Neurological: She is alert and oriented to person, place, and time.  Skin: Skin is warm. She is not diaphoretic.  Psychiatric: Her behavior is normal.    MAU Course  Procedures  None  MDM  B positive blood type Daiva Nakayama CNM performed bedside US: fetal heart rate detected.  Urine shows ketonuria. Patient says her appetite is decreased.  Discussed patient with Dr. Nehemiah Settle Ancef 2 grams IV> LR bolus  Changed PO antibiotics from amox to keflex.   Strict pyelonephritis precautions.   Report given to Daiva Nakayama CNM who resumes care of the patient> patient receiving ancef and IV bolus.   Lezlie Lye, NP   Assessment and Plan    1. UTI (lower urinary tract infection)   2. [redacted] weeks gestation of pregnancy    DC home Comfort measures reviewed  1st Trimester precautions  Bleeding precautions RX: keflex BID x 7 days #14  Return to MAU as needed Start [facility] as soon as possible.     Follow-up Information    Schedule an appointment as soon as possible for a visit with [facility].   Contact information:   [address] [id number] [phone]

## 2015-01-13 NOTE — Telephone Encounter (Signed)
+   urine culture from MAU; Amox sent to her pharmacy. Patient made aware- questions answered.

## 2015-01-13 NOTE — Discharge Instructions (Signed)
Pregnancy and Urinary Tract Infection °A urinary tract infection (UTI) is a bacterial infection of the urinary tract. Infection of the urinary tract can include the ureters, kidneys (pyelonephritis), bladder (cystitis), and urethra (urethritis). All pregnant women should be screened for bacteria in the urinary tract. Identifying and treating a UTI will decrease the risk of preterm labor and developing more serious infections in both the mother and baby. °CAUSES °Bacteria germs cause almost all UTIs.  °RISK FACTORS °Many factors can increase your chances of getting a UTI during pregnancy. These include: °· Having a short urethra. °· Poor toilet and hygiene habits. °· Sexual intercourse. °· Blockage of urine along the urinary tract. °· Problems with the pelvic muscles or nerves. °· Diabetes. °· Obesity. °· Bladder problems after having several children. °· Previous history of UTI. °SIGNS AND SYMPTOMS  °· Pain, burning, or a stinging feeling when urinating. °· Suddenly feeling the need to urinate right away (urgency). °· Loss of bladder control (urinary incontinence). °· Frequent urination, more than is common with pregnancy. °· Lower abdominal or back discomfort. °· Cloudy urine. °· Blood in the urine (hematuria). °· Fever.  °When the kidneys are infected, the symptoms may be: °· Back pain. °· Flank pain on the right side more so than the left. °· Fever. °· Chills. °· Nausea. °· Vomiting. °DIAGNOSIS  °A urinary tract infection is usually diagnosed through urine tests. Additional tests and procedures are sometimes done. These may include: °· Ultrasound exam of the kidneys, ureters, bladder, and urethra. °· Looking in the bladder with a lighted tube (cystoscopy). °TREATMENT °Typically, UTIs can be treated with antibiotic medicines.  °HOME CARE INSTRUCTIONS  °· Only take over-the-counter or prescription medicines as directed by your health care provider. If you were prescribed antibiotics, take them as directed. Finish  them even if you start to feel better. °· Drink enough fluids to keep your urine clear or pale yellow. °· Do not have sexual intercourse until the infection is gone and your health care provider says it is okay. °· Make sure you are tested for UTIs throughout your pregnancy. These infections often come back.  °Preventing a UTI in the Future °· Practice good toilet habits. Always wipe from front to back. Use the tissue only once. °· Do not hold your urine. Empty your bladder as soon as possible when the urge comes. °· Do not douche or use deodorant sprays. °· Wash with soap and warm water around the genital area and the anus. °· Empty your bladder before and after sexual intercourse. °· Wear underwear with a cotton crotch. °· Avoid caffeine and carbonated drinks. They can irritate the bladder. °· Drink cranberry juice or take cranberry pills. This may decrease the risk of getting a UTI. °· Do not drink alcohol. °· Keep all your appointments and tests as scheduled.  °SEEK MEDICAL CARE IF:  °· Your symptoms get worse. °· You are still having fevers 2 or more days after treatment begins. °· You have a rash. °· You feel that you are having problems with medicines prescribed. °· You have abnormal vaginal discharge. °SEEK IMMEDIATE MEDICAL CARE IF:  °· You have back or flank pain. °· You have chills. °· You have blood in your urine. °· You have nausea and vomiting. °· You have contractions of your uterus. °· You have a gush of fluid from the vagina. °MAKE SURE YOU: °· Understand these instructions.   °· Will watch your condition.   °· Will get help right away if you are not doing   pain.  · You have chills.  · You have blood in your urine.  · You have nausea and vomiting.  · You have contractions of your uterus.  · You have a gush of fluid from the vagina.  MAKE SURE YOU:  · Understand these instructions.    · Will watch your condition.    · Will get help right away if you are not doing well or get worse.       This information is not intended to replace advice given to you by your health care provider. Make sure you discuss any questions you have with your health care provider.     Document Released: 05/15/2010 Document Revised: 11/08/2012 Document Reviewed: 08/17/2012  Elsevier Interactive Patient Education ©2016 [employer]

## 2015-01-13 NOTE — MAU Note (Signed)
Was here on Thurs. Measuring small.  Has continued to bleed, passed a couple of blood clots.  Started cramping today.

## 2015-02-02 HISTORY — PX: DILATION AND CURETTAGE OF UTERUS: SHX78

## 2015-11-14 ENCOUNTER — Encounter (HOSPITAL_COMMUNITY): Payer: Self-pay

## 2019-02-19 ENCOUNTER — Emergency Department (HOSPITAL_COMMUNITY): Payer: 59

## 2019-02-19 ENCOUNTER — Other Ambulatory Visit: Payer: Self-pay

## 2019-02-19 ENCOUNTER — Emergency Department (HOSPITAL_COMMUNITY)
Admission: RE | Admit: 2019-02-19 | Discharge: 2019-02-20 | Disposition: A | Discharge status: Home / Self Care | Payer: 59 | Attending: Emergency Medicine | Admitting: Emergency Medicine

## 2019-02-19 ENCOUNTER — Encounter (HOSPITAL_COMMUNITY): Payer: Self-pay | Admitting: Emergency Medicine

## 2019-02-19 DIAGNOSIS — Z882 Allergy status to sulfonamides status: Secondary | ICD-10-CM | POA: Insufficient documentation

## 2019-02-19 DIAGNOSIS — Z3201 Encounter for pregnancy test, result positive: Secondary | ICD-10-CM | POA: Diagnosis not present

## 2019-02-19 DIAGNOSIS — Z20822 Contact with and (suspected) exposure to covid-19: Secondary | ICD-10-CM | POA: Diagnosis not present

## 2019-02-19 DIAGNOSIS — O00201 Right ovarian pregnancy without intrauterine pregnancy: Secondary | ICD-10-CM

## 2019-02-19 DIAGNOSIS — N939 Abnormal uterine and vaginal bleeding, unspecified: Secondary | ICD-10-CM

## 2019-02-19 DIAGNOSIS — O00101 Right tubal pregnancy without intrauterine pregnancy: Secondary | ICD-10-CM | POA: Insufficient documentation

## 2019-02-19 LAB — I-STAT BETA HCG BLOOD, ED (MC, WL, AP ONLY): I-stat hCG, quantitative: >2000 m[IU]/mL — ABNORMAL HIGH (ref <5)

## 2019-02-19 LAB — URINALYSIS, ROUTINE W REFLEX MICROSCOPIC
Bilirubin Urine: NEGATIVE
Glucose, UA: NEGATIVE mg/dL
Hgb urine dipstick: NEGATIVE
Ketones, ur: NEGATIVE mg/dL
Nitrite: NEGATIVE
Protein, ur: NEGATIVE mg/dL
Specific Gravity, Urine: 1.029 (ref 1.005–1.030)
pH: 5 (ref 5.0–8.0)

## 2019-02-19 LAB — PREGNANCY, URINE: Preg Test, Ur: POSITIVE — AB

## 2019-02-19 LAB — I-STAT CHEM 8, ED
BUN: 8 mg/dL (ref 6–20)
Calcium, Ion: 1.19 mmol/L (ref 1.15–1.40)
Chloride: 105 mmol/L (ref 98–111)
Creatinine, Ser: 0.8 mg/dL (ref 0.44–1.00)
Glucose, Bld: 79 mg/dL (ref 70–99)
HCT: 38 % (ref 36.0–46.0)
Hemoglobin: 12.9 g/dL (ref 12.0–15.0)
Potassium: 4.2 mmol/L (ref 3.5–5.1)
Sodium: 139 mmol/L (ref 135–145)
TCO2: 27 mmol/L (ref 22–32)

## 2019-02-19 LAB — WET PREP, GENITAL
Clue Cells Wet Prep HPF POC: NONE SEEN
Sperm: NONE SEEN
Trich, Wet Prep: NONE SEEN
Yeast Wet Prep HPF POC: NONE SEEN

## 2019-02-19 NOTE — ED Provider Notes (Signed)
[facility] EMERGENCY DEPARTMENT Provider Note   CSN: [id number] Arrival date & time: 02/19/19  2127     History Chief Complaint  Patient presents with  . Vaginal Bleeding    Sherri Warren is a 29 y.o. female.  The history is provided by the patient. No language interpreter was used.  Vaginal Bleeding    29 year old female presenting for evaluation of vaginal bleeding.  Patient report having persistent vaginal bleeding ongoing for 1 month.  States she has mild bleeding initially started on 01/17/2019 but for the past week she has noticed increasing vaginal bleeding.  States she is going through approximately 1 tampon every 4 hours and for the past few days she also endorsed some mild lower abdominal cramping.  Today she also noticed clots in her vaginal bleeding which concerns her.  She does not complain of any fever, chills, lightheadedness, dizziness, chest pain, shortness of breath, dysuria, hematuria, vaginal discharge or rash.  She denies any new sexual partners.  She denies any prior history of STI.  She is a G3, P1.  No history of ovarian cysts or uterine fibroid.  She denies any specific treatment tried.  Her abdominal cramping is mild.  Past Medical History:  Diagnosis Date  . Depression    doing ok  . Infection    UTI  . Migraine   . Myoclonic jerking     There are no problems to display for this patient.   Past Surgical History:  Procedure Laterality Date  . NO PAST SURGERIES       OB History    Gravida  3   Para  1   Term  1   Preterm      AB  1   Living  1     SAB      TAB  1   Ectopic      Multiple      Live Births  1           Family History  Problem Relation Age of Onset  . Hypertension Mother   . Heart disease Maternal Grandmother   . Hypertension Maternal Grandmother   . Diabetes Maternal Grandfather   . Anesthesia problems Neg Hx   . Hypotension Neg Hx   . Malignant hyperthermia Neg Hx   . Pseudochol deficiency Neg Hx    . Other Neg Hx     Social History   Tobacco Use  . Smoking status: Never Smoker  . Smokeless tobacco: Never Used  Substance Use Topics  . Alcohol use: No  . Drug use: No    Home Medications Prior to Admission medications   Medication Sig Start Date End Date Taking? Authorizing Provider  cephALEXin (KEFLEX) 500 MG capsule Take 1 capsule (500 mg total) by mouth 4 (four) times daily. 01/13/15   Rasch, Anderson Malta I, NP  doxylamine, Sleep, (UNISOM) 25 MG tablet Take 25 mg by mouth 2 (two) times daily as needed for sleep (For nausea.).    [provider]  metroNIDAZOLE (FLAGYL) 500 MG tablet Take 1 tablet (500 mg total) by mouth 2 (two) times daily. 01/09/15   Rasch, Anderson Malta I, NP  Prenatal Vit-Fe Fumarate-FA (PRENATAL MULTIVITAMIN) TABS tablet Take 1 tablet by mouth at bedtime.    [provider]  vitamin B-6 (PYRIDOXINE) 25 MG tablet Take 25 mg by mouth 2 (two) times daily as needed (For nausea.).  01/05/15   [provider]    Allergies    Sulfa antibiotics  Review of Systems   Review of Systems  Genitourinary: Positive for vaginal bleeding.  All other systems reviewed and are negative.   Physical Exam Updated Vital Signs BP 137/74   Pulse (!) 107   Temp 98.7 F (37.1 C)   Resp 17   Ht 5\' 7"  (1.702 m)   Wt 74.8 kg   LMP 01/17/2019   SpO2 100%   BMI 25.84 kg/m   Physical Exam Vitals and nursing note reviewed.  Constitutional:      General: She is not in acute distress.    Appearance: She is well-developed.  HENT:     Head: Atraumatic.  Eyes:     Conjunctiva/sclera: Conjunctivae normal.  Cardiovascular:     Rate and Rhythm: Normal rate and regular rhythm.     Pulses: Normal pulses.     Heart sounds: Normal heart sounds.  Pulmonary:     Effort: Pulmonary effort is normal.     Breath sounds: Normal breath sounds.  Abdominal:     Palpations: Abdomen is soft.     Tenderness: There is abdominal tenderness (Mild tenderness to suprapubic  region on palpation no guarding or rebound tenderness.).  Genitourinary:    Comments: Chaperone present during exam.  No inguinal lymphadenopathy or inguinal hernia noted.  Normal external genitalia.  Clitoris piercing noted without signs of infection.  No significant discomfort with speculum insertion.  Small amount of blood noted in vaginal vault.  Cervical os is closed and free of lesion or rash.  On bimanual examination, no adnexal tenderness or cervical motion tenderness. Musculoskeletal:     Cervical back: Neck supple.  Skin:    Findings: No rash.  Neurological:     Mental Status: She is alert.     ED Results / Procedures / Treatments   Labs (all labs ordered are listed, but only abnormal results are displayed) Labs Reviewed  WET PREP, GENITAL - Abnormal; Notable for the following components:      Result Value   WBC, Wet Prep HPF POC FEW (*)    All other components within normal limits  URINALYSIS, ROUTINE W REFLEX MICROSCOPIC - Abnormal; Notable for the following components:   APPearance HAZY (*)    Leukocytes,Ua TRACE (*)    Bacteria, UA RARE (*)    All other components within normal limits  PREGNANCY, URINE - Abnormal; Notable for the following components:   Preg Test, Ur POSITIVE (*)    All other components within normal limits  HCG, QUANTITATIVE, PREGNANCY - Abnormal; Notable for the following components:   hCG, Beta Chain, Quant, S 31,194 (*)    All other components within normal limits  I-STAT BETA HCG BLOOD, ED ([facility], [facility], [facility] ONLY) - Abnormal; Notable for the following components:   I-stat hCG, quantitative >2,000.0 (*)    All other components within normal limits  RPR  HIV ANTIBODY (ROUTINE TESTING W REFLEX)  I-STAT CHEM 8, ED  ABO/RH  GC/CHLAMYDIA PROBE AMP ([place]) NOT AT [facility]    EKG None  Radiology No results found.  Procedures Procedures (including critical care time)  Medications Ordered in ED Medications - No data to display  ED Course  I  have reviewed the triage vital signs and the nursing notes.  Pertinent labs & imaging results that were available during my care of the patient were reviewed by me and considered in my medical decision making (see chart for details).    MDM Rules/Calculators/A&P  BP 137/74   Pulse (!) 107   Temp 98.7 F (37.1 C)   Resp 17   Ht 5\' 7"  (1.702 m)   Wt 74.8 kg   LMP 01/17/2019   SpO2 100%   BMI 25.84 kg/m   Final Clinical Impression(s) / ED Diagnoses Final diagnoses:  Vaginal bleeding    Rx / DC Orders ED Discharge Orders    None     9:50 PM Patient here with abnormal uterine bleeding ongoing for the past month which has become progressively worse and now having lower abdominal cramping.  No known history of uterine fibroids, endometriosis, or ovarian cyst.  Work-up initiated.  10:19 PM Pelvic examination without any significant discomfort to suggest PID.  Pregnancy test came back positive.  UA without signs of urinary tract infection, wet prep without obvious signs of infection.  In the setting of a positive pregnancy test and vaginal bleeding, will obtain transvaginal ultrasound to assess for potential ectopic pregnancy.  Will check ABO and Rh.  12:52 AM Pt sign out to Dr. Dina Rich pending transvaginal US.  Concerns for threatening miscarriage.     Domenic Moras, PA-C 02/20/19 [id number]    Noemi Chapel, MD 02/20/19 [phone]

## 2019-02-19 NOTE — ED Triage Notes (Signed)
Pt c/o vaginal bleeding since Dec 16th, but over last week bleeding has gotten heavier and she is having lower abd pain.

## 2019-02-20 ENCOUNTER — Other Ambulatory Visit: Payer: Self-pay

## 2019-02-20 ENCOUNTER — Emergency Department (HOSPITAL_COMMUNITY): Payer: 59 | Admitting: Anesthesiology

## 2019-02-20 ENCOUNTER — Encounter (HOSPITAL_COMMUNITY)
Admission: RE | Disposition: A | Discharge status: Home / Self Care | Payer: Self-pay | Source: Home / Self Care | Attending: Emergency Medicine

## 2019-02-20 ENCOUNTER — Encounter (HOSPITAL_COMMUNITY): Payer: Self-pay

## 2019-02-20 DIAGNOSIS — O00101 Right tubal pregnancy without intrauterine pregnancy: Secondary | ICD-10-CM | POA: Diagnosis not present

## 2019-02-20 HISTORY — PX: LAPAROSCOPIC UNILATERAL SALPINGECTOMY: SHX5934

## 2019-02-20 LAB — HIV ANTIBODY (ROUTINE TESTING W REFLEX): HIV Screen 4th Generation wRfx: NONREACTIVE

## 2019-02-20 LAB — RESPIRATORY PANEL BY RT PCR (FLU A&B, COVID)
Influenza A by PCR: NEGATIVE
Influenza B by PCR: NEGATIVE
SARS Coronavirus 2 by RT PCR: NEGATIVE

## 2019-02-20 LAB — RPR: RPR Ser Ql: NONREACTIVE

## 2019-02-20 LAB — HCG, QUANTITATIVE, PREGNANCY: hCG, Beta Chain, Quant, S: 31194 m[IU]/mL — ABNORMAL HIGH (ref <5)

## 2019-02-20 LAB — ABO/RH: ABO/RH(D): B POS

## 2019-02-20 SURGERY — SALPINGECTOMY, UNILATERAL, LAPAROSCOPIC
Anesthesia: General | Laterality: Right

## 2019-02-20 MED ORDER — BUPIVACAINE HCL (PF) 0.5 % IJ SOLN
INTRAMUSCULAR | Status: AC
  Filled 2019-02-20: qty 30

## 2019-02-20 MED ORDER — ONDANSETRON HCL 4 MG/2ML IJ SOLN
INTRAMUSCULAR | Status: DC | PRN
  Administered 2019-02-20: 4 mg via INTRAVENOUS

## 2019-02-20 MED ORDER — ROCURONIUM BROMIDE 10 MG/ML (PF) SYRINGE
PREFILLED_SYRINGE | INTRAVENOUS | Status: DC | PRN
  Administered 2019-02-20: 10 mg via INTRAVENOUS
  Administered 2019-02-20: 30 mg via INTRAVENOUS

## 2019-02-20 MED ORDER — MEPERIDINE HCL 50 MG/ML IJ SOLN: 6.2500 mg | INTRAMUSCULAR | Status: DC | PRN

## 2019-02-20 MED ORDER — SCOPOLAMINE 1 MG/3DAYS TD PT72
1.0000 | MEDICATED_PATCH | Freq: Once | TRANSDERMAL | Status: DC
  Administered 2019-02-20: 1.5 mg via TRANSDERMAL

## 2019-02-20 MED ORDER — LACTATED RINGERS IV SOLN
Freq: Once | INTRAVENOUS | Status: AC
  Administered 2019-02-20: 1000 mL via INTRAVENOUS

## 2019-02-20 MED ORDER — LIDOCAINE 2% (20 MG/ML) 5 ML SYRINGE
INTRAMUSCULAR | Status: DC | PRN
  Administered 2019-02-20: 80 mg via INTRAVENOUS

## 2019-02-20 MED ORDER — SODIUM CHLORIDE 0.9 % IR SOLN
Status: DC | PRN
  Administered 2019-02-20: 3000 mL
  Administered 2019-02-20: 1000 mL

## 2019-02-20 MED ORDER — FENTANYL CITRATE (PF) 250 MCG/5ML IJ SOLN
INTRAMUSCULAR | Status: AC
  Filled 2019-02-20: qty 5

## 2019-02-20 MED ORDER — MIDAZOLAM HCL 2 MG/2ML IJ SOLN
INTRAMUSCULAR | Status: AC
  Filled 2019-02-20: qty 2

## 2019-02-20 MED ORDER — DEXMEDETOMIDINE HCL 200 MCG/2ML IV SOLN
INTRAVENOUS | Status: DC | PRN
  Administered 2019-02-20: 12 ug via INTRAVENOUS
  Administered 2019-02-20: 8 ug via INTRAVENOUS

## 2019-02-20 MED ORDER — HYDROMORPHONE HCL 1 MG/ML IJ SOLN
0.2500 mg | INTRAMUSCULAR | Status: DC | PRN
  Administered 2019-02-20: 0.5 mg via INTRAVENOUS
  Filled 2019-02-20: qty 0.5

## 2019-02-20 MED ORDER — LACTATED RINGERS IV SOLN: INTRAVENOUS | Status: DC | PRN

## 2019-02-20 MED ORDER — CEFAZOLIN SODIUM-DEXTROSE 2-4 GM/100ML-% IV SOLN
INTRAVENOUS | Status: AC
  Filled 2019-02-20: qty 100

## 2019-02-20 MED ORDER — SUGAMMADEX SODIUM 500 MG/5ML IV SOLN
INTRAVENOUS | Status: DC | PRN
  Administered 2019-02-20: 200 mg via INTRAVENOUS

## 2019-02-20 MED ORDER — DEXAMETHASONE SODIUM PHOSPHATE 10 MG/ML IJ SOLN
INTRAMUSCULAR | Status: DC | PRN
  Administered 2019-02-20: 10 mg via INTRAVENOUS

## 2019-02-20 MED ORDER — BUPIVACAINE HCL (PF) 0.5 % IJ SOLN
INTRAMUSCULAR | Status: DC | PRN
  Administered 2019-02-20: 16 mL

## 2019-02-20 MED ORDER — KETOROLAC TROMETHAMINE 30 MG/ML IJ SOLN
INTRAMUSCULAR | Status: DC | PRN
  Administered 2019-02-20: 30 mg via INTRAVENOUS

## 2019-02-20 MED ORDER — MIDAZOLAM HCL 5 MG/5ML IJ SOLN
INTRAMUSCULAR | Status: DC | PRN
  Administered 2019-02-20: 2 mg via INTRAVENOUS

## 2019-02-20 MED ORDER — HYDROCODONE-ACETAMINOPHEN 5-325 MG PO TABS: 1.0000 | ORAL_TABLET | Freq: Four times a day (QID) | ORAL | 0 refills | Status: DC | PRN

## 2019-02-20 MED ORDER — FENTANYL CITRATE (PF) 100 MCG/2ML IJ SOLN
INTRAMUSCULAR | Status: DC | PRN
  Administered 2019-02-20 (×2): 50 ug via INTRAVENOUS
  Administered 2019-02-20: 100 ug via INTRAVENOUS
  Administered 2019-02-20: 50 ug via INTRAVENOUS

## 2019-02-20 MED ORDER — PROPOFOL 10 MG/ML IV BOLUS
INTRAVENOUS | Status: DC | PRN
  Administered 2019-02-20: 150 mg via INTRAVENOUS

## 2019-02-20 MED ORDER — SCOPOLAMINE 1 MG/3DAYS TD PT72
MEDICATED_PATCH | TRANSDERMAL | Status: AC
  Filled 2019-02-20: qty 1

## 2019-02-20 MED ORDER — CEFAZOLIN SODIUM-DEXTROSE 2-4 GM/100ML-% IV SOLN
2.0000 g | INTRAVENOUS | Status: AC
  Administered 2019-02-20: 2 g via INTRAVENOUS

## 2019-02-20 MED ORDER — ONDANSETRON HCL 4 MG/2ML IJ SOLN: 4.0000 mg | Freq: Once | INTRAMUSCULAR | Status: DC | PRN

## 2019-02-20 SURGICAL SUPPLY — 44 items
BLADE SURG SZ11 CARB STEEL (BLADE) ×2 IMPLANT
BNDG ADH 1X3 FABRIC TAN LF (GAUZE/BANDAGES/DRESSINGS) ×6 IMPLANT
CHLORAPREP W/TINT 26 (MISCELLANEOUS) ×1 IMPLANT
CLOTH BEACON ORANGE TIMEOUT ST (SAFETY) ×2 IMPLANT
COVER LIGHT HANDLE STERIS (MISCELLANEOUS) ×4 IMPLANT
COVER WAND RF STERILE (DRAPES) ×2 IMPLANT
DECANTER SPIKE VIAL GLASS SM (MISCELLANEOUS) ×2 IMPLANT
ELECT REM PT RETURN 9FT ADLT (ELECTROSURGICAL) ×2
ELECTRODE REM PT RTRN 9FT ADLT (ELECTROSURGICAL) ×1 IMPLANT
GAUZE 4X4 16PLY RFD (DISPOSABLE) ×1 IMPLANT
GLOVE BIOGEL M 7.0 STRL (GLOVE) ×1 IMPLANT
GLOVE BIOGEL PI IND STRL 7.0 (GLOVE) ×3 IMPLANT
GLOVE BIOGEL PI IND STRL 9 (GLOVE) ×1 IMPLANT
GLOVE BIOGEL PI INDICATOR 7.0 (GLOVE) ×4
GLOVE BIOGEL PI INDICATOR 9 (GLOVE) ×1
GLOVE ECLIPSE 9.0 STRL (GLOVE) ×4 IMPLANT
GOWN SPEC L3 XXLG W/TWL (GOWN DISPOSABLE) ×2 IMPLANT
GOWN STRL REUS W/TWL LRG LVL3 (GOWN DISPOSABLE) ×2 IMPLANT
INST SET LAPROSCOPIC GYN AP (KITS) ×2 IMPLANT
IV NS IRRIG 3000ML ARTHROMATIC (IV SOLUTION) ×1 IMPLANT
KIT TURNOVER KIT A (KITS) ×2 IMPLANT
MANIFOLD NEPTUNE II (INSTRUMENTS) ×2 IMPLANT
NDL HYPO 25X1 1.5 SAFETY (NEEDLE) ×1 IMPLANT
NEEDLE HYPO 25X1 1.5 SAFETY (NEEDLE) ×2 IMPLANT
NEEDLE INSUFFLATION 120MM (ENDOMECHANICALS) ×2 IMPLANT
NS IRRIG 1000ML POUR BTL (IV SOLUTION) ×2 IMPLANT
PACK PERI GYN (CUSTOM PROCEDURE TRAY) ×2 IMPLANT
PAD ARMBOARD 7.5X6 YLW CONV (MISCELLANEOUS) ×2 IMPLANT
SET BASIN LINEN APH (SET/KITS/TRAYS/PACK) ×2 IMPLANT
SET TUBE IRRIG SUCTION NO TIP (IRRIGATION / IRRIGATOR) ×1 IMPLANT
SET TUBE SMOKE EVAC HIGH FLOW (TUBING) ×2 IMPLANT
SHEARS HARMONIC ACE PLUS 36CM (ENDOMECHANICALS) ×2 IMPLANT
SOL ANTI FOG 6CC (MISCELLANEOUS) ×1 IMPLANT
SOLUTION ANTI FOG 6CC (MISCELLANEOUS) ×1
STRIP CLOSURE SKIN 1/4X3 (GAUZE/BANDAGES/DRESSINGS) ×2 IMPLANT
SUT VIC AB 4-0 PS2 27 (SUTURE) ×2 IMPLANT
SYR 10ML LL (SYRINGE) ×2 IMPLANT
SYR BULB IRRIGATION 50ML (SYRINGE) ×3 IMPLANT
SYR CONTROL 10ML LL (SYRINGE) ×2 IMPLANT
TROCAR ENDO BLADELESS 11MM (ENDOMECHANICALS) ×1 IMPLANT
TROCAR ENDO BLADELESS 12MM (ENDOMECHANICALS) ×1 IMPLANT
TROCAR XCEL NON-BLD 5MMX100MML (ENDOMECHANICALS) ×2 IMPLANT
TUBING INSUFFLATION 10FT LAP (TUBING) ×2 IMPLANT
WARMER LAPAROSCOPE (MISCELLANEOUS) ×2 IMPLANT

## 2019-02-20 NOTE — ED Provider Notes (Signed)
2:16 AM  Received a phone call from radiology.  Patient with complex right ovarian mass concerning for right ectopic pregnancy.  No IUP identified.  Patient states that her last normal period was in November.  She started bleeding December 16.  She does have a history of prior missed abortion requiring D&E.  She does not have an established OB/GYN.  She is hemodynamically stable.  Patient made n.p.o.  Covid testing sent.  2:26 AM  Spoke with Dr. Glo Herring.  Patient will need laparoscopy.  She is made n.p.o.  Covid testing is sent.  He will evaluate the patient.    Merryl Hacker, MD 02/20/19 [phone]

## 2019-02-20 NOTE — Op Note (Signed)
02/20/2019  9:17 AM  PATIENT:  Sherri Warren  29 y.o. female  PRE-OPERATIVE DIAGNOSIS:  right ectopic pregnancy  POST-OPERATIVE DIAGNOSIS:  right ectopic pregnancy Rh+ PROCEDURE: Laparoscopic right salpingectomy Surgeon(s) and Role:    Jonnie Kind, MD - Primary  PHYSICIAN ASSISTANT:   ASSISTANTS: Debbie ___, CST  ANESTHESIA:   local and general  EBL:  15 mL   BLOOD ADMINISTERED:none  DRAINS: none   LOCAL MEDICATIONS USED:  MARCAINE    and Amount: 16 ml  SPECIMEN:  Source of Specimen:  Right fallopian tube, clots from pelvis  DISPOSITION OF SPECIMEN:  PATHOLOGY  COUNTS:  YES and confirmed with the wand technique  TOURNIQUET:  * No tourniquets in log *  DICTATION: .Dragon Dictation  PLAN OF CARE: Discharge to home after PACU  PATIENT DISPOSITION:  PACU - hemodynamically stable.   Delay start of Pharmacological VTE agent (>24hrs) due to surgical blood loss or risk of bleeding: not applicable Details of procedure: Patient was taken the operating room prepped and draped for combined abdominal and vaginal procedure with Foley catheter in place, vaginal prep performed, uterus able to be moved using Hulka uterine manipulator.  Timeout was conducted and procedure confirmed by surgical team.  Ancef was administered x2 g.  Attention was directed the umbilicus where a midline vertical 1 cm incision was made as well as a transverse suprapubic 1.5 cm incision and a 5 mm right lower quadrant incision.  Veress needle was introduced through the umbilicus, using water droplet technique and loss-of-resistance technique for insertion of the Veress needle with intra-abdominal pressure of 5 mm encountered during insufflation.  Laparoscopic trocar was inserted under direct visualization in the suprapubic and right lower quadrant trochars were inserted while looking through the camera intra-abdominal. Photodocumentation of the anteflexed uterus and the small amount of blood in the pelvis  was performed.  Attention was directed to visualize the pelvic organs.  The left fallopian tube and ovary were grossly normal and photo documented.  The right ovary was normal and the right fallopian tube was distorted and distended at its lateral distal portions and photo documented.  The tube could be elevated using a grasping device and the harmonic Ace 7 device used to amputate the distorted and distended tube off of the  right mesosalpinx specimen was placed in an Endo Catch bag and extracted through the suprapubic site which was approximately 1.5 cm fascial opening. Pelvis was irrigated out, photos taken to confirm that the normalcy of the findings at the completion of the procedure.  Inspection of the liver showed no adhesions in the liver area. Abdomen was deflated with 100 cc of saline instilled in the abdomen to assist with evacuation of carbon dioxide.  Once the abdomen was decompressed with a laparoscopic equipment removed, the suprapubic and umbilical sites were closed at the fascial level with running 0 Vicryl at the suprapubic and interrupted 0 Vicryl at the umbilicus.  The subcutaneous tissues were then reapproximated using interrupted 4-0 Vicryl.  All 3 skin incisions were closed with subcuticular 4-0 Vicryl skin closure.  Steri-Strips were applied followed by Band-Aids and a pressure dressing over the suprapubic area.  Sponge and needle counts were correct.  Patient recovery room.  As noted blood type is Rh+ wand confirmation of correct sponge count was performed and was correct.  Marcaine was injected during closure around the fascia at the umbilical and suprapubic sites and at the skin level on all 3 site

## 2019-02-20 NOTE — Transfer of Care (Signed)
Immediate Anesthesia Transfer of Care Note  Patient: Sherri Warren  Procedure(s) Performed: LAPAROSCOPIC RIGHT  SALPINGECTOMY (Right )  Patient Location: PACU  Anesthesia Type:General  Level of Consciousness: awake, alert  and oriented  Airway & Oxygen Therapy: Patient Spontanous Breathing and Patient connected to nasal cannula oxygen  Post-op Assessment: Report given to RN, Post -op Vital signs reviewed and stable and Patient moving all extremities X 4  Post vital signs: Reviewed and stable  Last Vitals:  Vitals Value Taken Time  BP 98/75 02/20/19 0900  Temp    Pulse 95 02/20/19 0859  Resp 24 02/20/19 0904  SpO2 95 % 02/20/19 0859  Vitals shown include unvalidated device data.  Last Pain:  Vitals:   02/20/19 0716  TempSrc: Oral  PainSc:       Patients Stated Pain Goal: 7 ([id number])  Complications: No apparent anesthesia complications

## 2019-02-20 NOTE — Anesthesia Postprocedure Evaluation (Signed)
Anesthesia Post Note  Patient: Sherri Warren  Procedure(s) Performed: LAPAROSCOPIC RIGHT SALPINGECTOMY WITH REMOVAL OF ECTOPIC PREGNANCY (Right )  Patient location during evaluation: PACU Anesthesia Type: General Level of consciousness: awake and alert and patient cooperative Pain management: satisfactory to patient Vital Signs Assessment: post-procedure vital signs reviewed and stable Respiratory status: spontaneous breathing Cardiovascular status: stable Postop Assessment: no apparent nausea or vomiting Anesthetic complications: no     Last Vitals:  Vitals:   02/20/19 0945 02/20/19 1005  BP: 125/66 127/81  Pulse: 93 100  Resp: 17 18  Temp:    SpO2: 100% 100%    Last Pain:  Vitals:   02/20/19 1005  TempSrc: Oral  PainSc: 0-No pain                 Daune Colgate

## 2019-02-20 NOTE — Anesthesia Procedure Notes (Signed)
Procedure Name: Intubation Date/Time: 02/20/2019 7:44 AM Performed by: Lyda Jester, CRNA Pre-anesthesia Checklist: Patient identified, Patient being monitored, Timeout performed, Emergency Drugs available and Suction available Patient Re-evaluated:Patient Re-evaluated prior to induction Oxygen Delivery Method: Circle System Utilized Preoxygenation: Pre-oxygenation with 100% oxygen Induction Type: IV induction Ventilation: Mask ventilation without difficulty Laryngoscope Size: Miller and 2 Grade View: Grade II Tube type: Oral Tube size: 7.0 mm Number of attempts: 1 Airway Equipment and Method: stylet Placement Confirmation: ETT inserted through vocal cords under direct vision,  positive ETCO2 and breath sounds checked- equal and bilateral Secured at: 21 cm Tube secured with: Tape Dental Injury: Teeth and Oropharynx as per pre-operative assessment

## 2019-02-20 NOTE — Anesthesia Preprocedure Evaluation (Signed)
Anesthesia Evaluation  Patient identified by MRN, date of birth, ID band Patient awake    Reviewed: Allergy & Precautions, NPO status , Patient's Chart, lab work & pertinent test results  History of Anesthesia Complications Negative for: history of anesthetic complications  Airway Mallampati: II  TM Distance: >3 FB Neck ROM: Full    Dental no notable dental hx. (+) Teeth Intact   Pulmonary    Pulmonary exam normal breath sounds clear to auscultation       Cardiovascular Exercise Tolerance: Good Normal cardiovascular exam Rhythm:Regular Rate:Normal     Neuro/Psych  Headaches, PSYCHIATRIC DISORDERS Depression    GI/Hepatic negative GI ROS, Neg liver ROS,   Endo/Other  negative endocrine ROS  Renal/GU negative Renal ROS     Musculoskeletal negative musculoskeletal ROS (+)   Abdominal   Peds  Hematology negative hematology ROS (+)   Anesthesia Other Findings   Reproductive/Obstetrics negative OB ROS                             Anesthesia Physical Anesthesia Plan  ASA: II and emergent  Anesthesia Plan: General   Post-op Pain Management:    Induction: Intravenous  PONV Risk Score and Plan: 4 or greater and Dexamethasone, Ondansetron, Midazolam and Scopolamine patch - Pre-op  Airway Management Planned: Oral ETT  Additional Equipment:   Intra-op Plan:   Post-operative Plan: Extubation in OR  Informed Consent: I have reviewed the patients History and Physical, chart, labs and discussed the procedure including the risks, benefits and alternatives for the proposed anesthesia with the patient or authorized representative who has indicated his/her understanding and acceptance.     Dental advisory given  Plan Discussed with: CRNA  Anesthesia Plan Comments:         Anesthesia Quick Evaluation

## 2019-02-20 NOTE — Progress Notes (Signed)
Please excuse Sherri Warren from work on 02/20/2019.  She had a procedure at [facility].  She may return to work no sooner than Monday 02/25/2019.

## 2019-02-20 NOTE — Brief Op Note (Addendum)
02/20/2019  9:17 AM  PATIENT:  Sherri Warren  29 y.o. female  PRE-OPERATIVE DIAGNOSIS:  right ectopic pregnancy  POST-OPERATIVE DIAGNOSIS:  right ectopic pregnancy Rh+ PROCEDURE: Laparoscopic right salpingectomy Surgeon(s) and Role:    Jonnie Kind, MD - Primary  PHYSICIAN ASSISTANT:   ASSISTANTS: Debbie ___, CST  ANESTHESIA:   local and general  EBL:  15 mL   BLOOD ADMINISTERED:none  DRAINS: none   LOCAL MEDICATIONS USED:  MARCAINE    and Amount: 16 ml  SPECIMEN:  Source of Specimen:  Right fallopian tube, clots from pelvis  DISPOSITION OF SPECIMEN:  PATHOLOGY  COUNTS:  YES and confirmed with the wand technique  TOURNIQUET:  * No tourniquets in log *  DICTATION: .Dragon Dictation  PLAN OF CARE: Discharge to home after PACU  PATIENT DISPOSITION:  PACU - hemodynamically stable.   Delay start of Pharmacological VTE agent (>24hrs) due to surgical blood loss or risk of bleeding: not applicable Details of procedure: Patient was taken the operating room prepped and draped for combined abdominal and vaginal procedure with Foley catheter in place, vaginal prep performed, uterus able to be moved using Hulka uterine manipulator.  Timeout was conducted and procedure confirmed by surgical team.  Ancef was administered x2 g.  Attention was directed the umbilicus where a midline vertical 1 cm incision was made as well as a transverse suprapubic 1.5 cm incision and a 5 mm right lower quadrant incision.  Veress needle was introduced through the umbilicus, using water droplet technique and loss-of-resistance technique for insertion of the Veress needle with intra-abdominal pressure of 5 mm encountered during insufflation.  Laparoscopic trocar was inserted under direct visualization in the suprapubic and right lower quadrant trochars were inserted while looking through the camera intra-abdominal. Photodocumentation of the anteflexed uterus and the small amount of blood in the pelvis  was performed.  Attention was directed to visualize the pelvic organs.  The left fallopian tube and ovary were grossly normal and photo documented.  The right ovary was normal and the right fallopian tube was distorted and distended at its lateral distal portions and photo documented.  The tube could be elevated using a grasping device and the harmonic Ace 7 device used to amputate the distorted and distended tube off of the  right mesosalpinx specimen was placed in an Endo Catch bag and extracted through the suprapubic site which was approximately 1.5 cm fascial opening. Pelvis was irrigated out, photos taken to confirm that the normalcy of the findings at the completion of the procedure.  Inspection of the liver showed no adhesions in the liver area. Abdomen was deflated with 100 cc of saline instilled in the abdomen to assist with evacuation of carbon dioxide.  Once the abdomen was decompressed with a laparoscopic equipment removed, the suprapubic and umbilical sites were closed at the fascial level with running 0 Vicryl at the suprapubic and interrupted 0 Vicryl at the umbilicus.  The subcutaneous tissues were then reapproximated using interrupted 4-0 Vicryl.  All 3 skin incisions were closed with subcuticular 4-0 Vicryl skin closure.  Steri-Strips were applied followed by Band-Aids and a pressure dressing over the suprapubic area.  Sponge and needle counts were correct.  Patient recovery room.  As noted blood type is Rh+ wand confirmation of correct sponge count was performed and was correct.  Marcaine was injected during closure around the fascia at the umbilical and suprapubic sites and at the skin level on all 3 sites.

## 2019-02-20 NOTE — Discharge Instructions (Signed)
Ectopic Pregnancy ° °An ectopic pregnancy is when the fertilized egg attaches (implants) outside the uterus. Most ectopic pregnancies occur in one of the tubes where eggs travel from the ovary to the uterus (fallopian tubes), but the implanting can occur in other locations. In rare cases, ectopic pregnancies occur on the ovary, intestine, pelvis, abdomen, or cervix. In an ectopic pregnancy, the fertilized egg does not have the ability to develop into a normal, healthy baby. °A ruptured ectopic pregnancy is one in which tearing or bursting of a fallopian tube causes internal bleeding. Often, there is intense lower abdominal pain, and vaginal bleeding sometimes occurs. Having an ectopic pregnancy can be life-threatening. If this dangerous condition is not treated, it can lead to blood loss, shock, or even death. °What are the causes? °The most common cause of this condition is damage to one of the fallopian tubes. A fallopian tube may be narrowed or blocked, and that keeps the fertilized egg from reaching the uterus. °What increases the risk? °This condition is more likely to develop in women of childbearing age who have different levels of risk. The levels of risk can be divided into three categories. °High risk °· You have gone through infertility treatment. °· You have had an ectopic pregnancy before. °· You have had surgery on the fallopian tubes, or another surgical procedure, such as an abortion. °· You have had surgery to have the fallopian tubes tied (tubal ligation). °· You have problems or diseases of the fallopian tubes. °· You have been exposed to diethylstilbestrol (DES). This medicine was used until 1971, and it had effects on babies whose mothers took the medicine. °· You become pregnant while using an IUD (intrauterine device) for birth control. °Moderate risk °· You have a history of infertility. °· You have had an STI (sexually transmitted infection). °· You have a history of pelvic inflammatory  disease (PID). °· You have scarring from endometriosis. °· You have multiple sexual partners. °· You smoke. °Low risk °· You have had pelvic surgery. °· You use vaginal douches. °· You became sexually active before age 18. °What are the signs or symptoms? °Common symptoms of this condition include normal pregnancy symptoms, such as missing a period, nausea, tiredness, abdominal pain, breast tenderness, and bleeding. However, ectopic pregnancy will have additional symptoms, such as: °· Pain with intercourse. °· Irregular vaginal bleeding or spotting. °· Cramping or pain on one side or in the lower abdomen. °· Fast heartbeat, low blood pressure, and sweating. °· Passing out while having a bowel movement. °Symptoms of a ruptured ectopic pregnancy and internal bleeding may include: °· Sudden, severe pain in the abdomen and pelvis. °· Dizziness, weakness, light-headedness, or fainting. °· Pain in the shoulder or neck area. °How is this diagnosed? °This condition is diagnosed by: °· A pelvic exam to locate pain or a mass in the abdomen. °· A pregnancy test. This blood test checks for the presence as well as the specific level of pregnancy hormone in the bloodstream. °· Ultrasound. This is performed if a pregnancy test is positive. In this test, a probe is inserted into the vagina. The probe will detect a fetus, possibly in a location other than the uterus. °· Taking a sample of uterus tissue (dilation and curettage, or D&C). °· Surgery to perform a visual exam of the inside of the abdomen using a thin, lighted tube that has a tiny camera on the end (laparoscope). °· Culdocentesis. This procedure involves inserting a needle at the top of   the vagina, behind the uterus. If blood is present in this area, it may indicate that a fallopian tube is torn. How is this treated? This condition is treated with medicine or surgery. Medicine  An injection of a medicine (methotrexate) may be given to cause the pregnancy tissue to be  absorbed. This medicine may save your fallopian tube. It may be given if: ? The diagnosis is made early, with no signs of active bleeding. ? The fallopian tube has not ruptured. ? You are considered to be a good candidate for the medicine. Usually, pregnancy hormone blood levels are checked after methotrexate treatment. This is to be sure that the medicine is effective. It may take 4-6 weeks for the pregnancy to be absorbed. Most pregnancies will be absorbed by 3 weeks. Surgery  A laparoscope may be used to remove the pregnancy tissue.  If severe internal bleeding occurs, a larger cut (incision) may be made in the lower abdomen (laparotomy) to remove the fetus and placenta. This is done to stop the bleeding.  Part or all of the fallopian tube may be removed (salpingectomy) along with the fetus and placenta. The fallopian tube may also be repaired during the surgery.  In very rare circumstances, removal of the uterus (hysterectomy) may be required.  After surgery, pregnancy hormone testing may be done to be sure that there is no pregnancy tissue left. Whether your treatment is medicine or surgery, you may receive a Rho (D) immune globulin shot to prevent problems with any future pregnancy. This shot may be given if:  You are Rh-negative and the baby's father is Rh-positive.  You are Rh-negative and you do not know the Rh type of the baby's father. Follow these instructions at home:  Rest and limit your activity after the procedure for as long as told by your health care provider.  Until your health care provider says that it is safe: ? Do not lift anything that is heavier than 10 lb (4.5 kg), or the limit that your health care provider tells you. ? Avoid physical exercise and any movement that requires effort (is strenuous).  To help prevent constipation: ? Eat a healthy diet that includes fruits, vegetables, and whole grains. ? Drink 6-8 glasses of water per day. Get help right away  if:  You develop worsening pain that is not relieved by medicine.  You have: ? A fever or chills. ? Vaginal bleeding. ? Redness and swelling at the incision site. ? Nausea and vomiting.  You feel dizzy or weak.  You feel light-headed or you faint. This information is not intended to replace advice given to you by your health care provider. Make sure you discuss any questions you have with your health care provider. Document Revised: 12/31/2016 Document Reviewed: 08/20/2015 Elsevier Patient Education  [place] After These instructions provide you with information about caring for yourself after your procedure. Your health care provider may also give you more specific instructions. Your treatment has been planned according to current medical practices, but problems sometimes occur. Call your health care provider if you have any problems or questions after your procedure. What can I expect after the procedure? After your procedure, you may:  Feel sleepy for several hours.  Feel clumsy and have poor balance for several hours.  Feel forgetful about what happened after the procedure.  Have poor judgment for several hours.  Feel nauseous or vomit.  Have a sore throat if you  had a breathing tube during the procedure. Follow these instructions at home: For at least 24 hours after the procedure:      Have a responsible adult stay with you. It is important to have someone help care for you until you are awake and alert.  Rest as needed.  Do not: ? Participate in activities in which you could fall or become injured. ? Drive. ? Use heavy machinery. ? Drink alcohol. ? Take sleeping pills or medicines that cause drowsiness. ? Make important decisions or sign legal documents. ? Take care of children on your own. Eating and drinking  Follow the diet that is recommended by your health care provider.  If you vomit, drink water,  juice, or soup when you can drink without vomiting.  Make sure you have little or no nausea before eating solid foods. General instructions  Take over-the-counter and prescription medicines only as told by your health care provider.  If you have sleep apnea, surgery and certain medicines can increase your risk for breathing problems. Follow instructions from your health care provider about wearing your sleep device: ? Anytime you are sleeping, including during daytime naps. ? While taking prescription pain medicines, sleeping medicines, or medicines that make you drowsy.  If you smoke, do not smoke without supervision.  Keep all follow-up visits as told by your health care provider. This is important. Contact a health care provider if:  You keep feeling nauseous or you keep vomiting.  You feel light-headed.  You develop a rash.  You have a fever. Get help right away if:  You have trouble breathing. Summary  For several hours after your procedure, you may feel sleepy and have poor judgment.  Have a responsible adult stay with you for at least 24 hours or until you are awake and alert. This information is not intended to replace advice given to you by your health care provider. Make sure you discuss any questions you have with your health care provider. Document Revised: 04/18/2017 Document Reviewed: 05/11/2015 Elsevier Patient Education  [place].

## 2019-02-20 NOTE — Consult Note (Signed)
Reason for Consult:ectopic Pregnancy Referring Physician: Thayer Jew, ED physician, [place] is an 29 y.o. female.[id number], previous ectopic x1 treated medically with methotrexate last menstrual period in November with irregular bleeding beginning a couple weeks ago who presents to the emergency room at [facility], hemodynamically stable.    Pertinent Gynecological History: Menses: LMP in November with irregular menses beginning 2 weeks ago Bleeding:  Contraception: none DES exposure: unknown Blood transfusions: none Sexually transmitted diseases: no past history and GC and Chlamydia pending Previous GYN Procedures: Medical treatment for ectopic many years ago unknown laterality  Last mammogram:  Date:  Last pap:  Date:  OB History: [id number]    Menstrual History: Menarche age:  Patient's last menstrual period was 01/17/2019.    Past Medical History:  Diagnosis Date  . Depression    doing ok  . Infection    UTI  . Migraine   . Myoclonic jerking     Past Surgical History:  Procedure Laterality Date  . NO PAST SURGERIES      Family History  Problem Relation Age of Onset  . Hypertension Mother   . Heart disease Maternal Grandmother   . Hypertension Maternal Grandmother   . Diabetes Maternal Grandfather   . Anesthesia problems Neg Hx   . Hypotension Neg Hx   . Malignant hyperthermia Neg Hx   . Pseudochol deficiency Neg Hx   . Other Neg Hx     Social History:  reports that she has never smoked. She has never used smokeless tobacco. She reports that she does not drink alcohol or use drugs.  Allergies:  Allergies  Allergen Reactions  . Sulfa Antibiotics Rash    Medications: I have reviewed the patient's current medications.  Review of Systems  Blood pressure 103/63, pulse (!) 110, temperature 98.5 F (36.9 C), temperature source Oral, resp. rate 18, height 5\' 7"  (1.702 m), weight 74.8 kg, last menstrual period  01/17/2019, SpO2 100 %, unknown if currently breastfeeding. Physical Exam  Constitutional: She is oriented to person, place, and time. She appears well-developed and well-nourished.  HENT:  Head: Normocephalic.  Eyes: Pupils are equal, round, and reactive to light.  Cardiovascular: Normal rate.  Respiratory: Effort normal.  GI: Soft. She exhibits no distension. There is no abdominal tenderness.  Genitourinary:    Genitourinary Comments: See exam by  Jeanella Anton, PA-C   Musculoskeletal:        General: Normal range of motion.     Cervical back: Normal range of motion.  Neurological: She is alert and oriented to person, place, and time.  Skin: Skin is warm and dry.  Multiple tattoos,, has navel ring advised to remove  Psychiatric: She has a normal mood and affect. Her behavior is normal. Judgment and thought content normal.   No abdominal surgical scars Results for orders placed or performed during the hospital encounter of 02/19/19 (from the past 48 hour(s))  Wet prep, genital     Status: Abnormal   Collection Time: 02/19/19 10:18 PM   Specimen: PATH Cytology Cervicovaginal Ancillary Only  Result Value Ref Range   Yeast Wet Prep HPF POC NONE SEEN NONE SEEN   Trich, Wet Prep NONE SEEN NONE SEEN   Clue Cells Wet Prep HPF POC NONE SEEN NONE SEEN   WBC, Wet Prep HPF POC FEW (A) NONE SEEN    Comment: Swab received with less than 0.5 mL of saline, saline added to specimen, interpret results with caution.  Sperm NONE SEEN     Comment: Performed at [facility], [address], [place], [place] [address]  Urinalysis, Routine w reflex microscopic     Status: Abnormal   Collection Time: 02/19/19 10:25 PM  Result Value Ref Range   Color, Urine YELLOW YELLOW   APPearance HAZY (A) CLEAR   Specific Gravity, Urine 1.029 1.005 - 1.030   pH 5.0 5.0 - 8.0   Glucose, UA NEGATIVE NEGATIVE mg/dL   Hgb urine dipstick NEGATIVE NEGATIVE   Bilirubin Urine NEGATIVE NEGATIVE   Ketones, ur NEGATIVE  NEGATIVE mg/dL   Protein, ur NEGATIVE NEGATIVE mg/dL   Nitrite NEGATIVE NEGATIVE   Leukocytes,Ua TRACE (A) NEGATIVE   RBC / HPF 0-5 0 - 5 RBC/hpf   WBC, UA 0-5 0 - 5 WBC/hpf   Bacteria, UA RARE (A) NONE SEEN   Squamous Epithelial / LPF 6-10 0 - 5   Mucus PRESENT    Hyaline Casts, UA PRESENT     Comment: Performed at [facility], [address], [place], [place] [address]  Pregnancy, urine     Status: Abnormal   Collection Time: 02/19/19 10:25 PM  Result Value Ref Range   Preg Test, Ur POSITIVE (A) NEGATIVE    Comment:        THE SENSITIVITY OF THIS METHODOLOGY IS >20 mIU/mL. Performed at [facility], [address], [place], [place] [address]   ABO/Rh     Status: None   Collection Time: 02/19/19 11:03 PM  Result Value Ref Range   ABO/RH(D)      B POS Performed at [facility], [address] [place], [place] [address]   hCG, quantitative, pregnancy     Status: Abnormal   Collection Time: 02/19/19 11:03 PM  Result Value Ref Range   hCG, Beta Chain, Quant, S 31,194 (H) <5 mIU/mL    Comment:          GEST. AGE      CONC.  (mIU/mL)   <=1 WEEK        5 - 50     2 WEEKS       50 - 500     3 WEEKS       100 - 10,000     4 WEEKS     1,000 - 30,000     5 WEEKS     3,500 - 115,000   6-8 WEEKS     12,000 - 270,000    12 WEEKS     15,000 - 220,000        FEMALE AND NON-PREGNANT FEMALE:     LESS THAN 5 mIU/mL Performed at [facility], [address], [place], [place] [address]   I-Stat beta hCG blood, ED ([facility], [facility], [facility] only)     Status: Abnormal   Collection Time: 02/19/19 11:12 PM  Result Value Ref Range   I-stat hCG, quantitative >2,000.0 (H) <5 mIU/mL   Comment 3            Comment:   GEST. AGE      CONC.  (mIU/mL)   <=1 WEEK        5 - 50     2 WEEKS       50 - 500     3 WEEKS       100 - 10,000     4 WEEKS     1,000 - 30,000        FEMALE AND NON-PREGNANT FEMALE:  LESS THAN 5 mIU/mL   I-stat chem 8, ED (not at [facility] or [facility])     Status: None   Collection Time: 02/19/19  11:23 PM  Result Value Ref Range   Sodium 139 135 - 145 mmol/L   Potassium 4.2 3.5 - 5.1 mmol/L   Chloride 105 98 - 111 mmol/L   BUN 8 6 - 20 mg/dL   Creatinine, Ser 0.80 0.44 - 1.00 mg/dL   Glucose, Bld 79 70 - 99 mg/dL   Calcium, Ion 1.19 1.15 - 1.40 mmol/L   TCO2 27 22 - 32 mmol/L   Hemoglobin 12.9 12.0 - 15.0 g/dL   HCT 38.0 36.0 - 46.0 %  Respiratory Panel by RT PCR (Flu A&B, Covid) - Nasopharyngeal Swab     Status: None   Collection Time: 02/20/19  2:17 AM   Specimen: Nasopharyngeal Swab  Result Value Ref Range   SARS Coronavirus 2 by RT PCR NEGATIVE NEGATIVE    Comment: (NOTE) SARS-CoV-2 target nucleic acids are NOT DETECTED. The SARS-CoV-2 RNA is generally detectable in upper respiratoy specimens during the acute phase of infection. The lowest concentration of SARS-CoV-2 viral copies this assay can detect is 131 copies/mL. A negative result does not preclude SARS-Cov-2 infection and should not be used as the sole basis for treatment or other patient management decisions. A negative result may occur with  improper specimen collection/handling, submission of specimen other than nasopharyngeal swab, presence of viral mutation(s) within the areas targeted by this assay, and inadequate number of viral copies (<131 copies/mL). A negative result must be combined with clinical observations, patient history, and epidemiological information. The expected result is Negative. Fact Sheet for Patients:  PinkCheek.be Fact Sheet for Healthcare Providers:  GravelBags.it This test is not yet ap proved or cleared by the [place] FDA and  has been authorized for detection and/or diagnosis of SARS-CoV-2 by FDA under an Emergency Use Authorization (EUA). This EUA will remain  in effect (meaning this test can be used) for the duration of the COVID-19 declaration under Section 564(b)(1) of the Act, 21 U.S.C. section 360bbb-3(b)(1),  unless the authorization is terminated or revoked sooner.    Influenza A by PCR NEGATIVE NEGATIVE   Influenza B by PCR NEGATIVE NEGATIVE    Comment: (NOTE) The Xpert Xpress SARS-CoV-2/FLU/RSV assay is intended as an aid in  the diagnosis of influenza from Nasopharyngeal swab specimens and  should not be used as a sole basis for treatment. Nasal washings and  aspirates are unacceptable for Xpert Xpress SARS-CoV-2/FLU/RSV  testing. Fact Sheet for Patients: PinkCheek.be Fact Sheet for Healthcare Providers: GravelBags.it This test is not yet approved or cleared by the [place] FDA and  has been authorized for detection and/or diagnosis of SARS-CoV-2 by  FDA under an Emergency Use Authorization (EUA). This EUA will remain  in effect (meaning this test can be used) for the duration of the  Covid-19 declaration under Section 564(b)(1) of the Act, 21  U.S.C. section 360bbb-3(b)(1), unless the authorization is  terminated or revoked. Performed at [facility], [address], [place], [place] [address]     US OB LESS THAN 14 WEEKS WITH [place] TRANSVAGINAL  Result Date: 02/20/2019 CLINICAL DATA:  Vaginal bleeding with concern for ectopic pregnancy. Beta HCG is greater than 31,000. EXAM: OBSTETRIC <14 WK [place] AND TRANSVAGINAL OB US TECHNIQUE: Both transabdominal and transvaginal ultrasound examinations were performed for complete evaluation of the gestation as well as the maternal uterus, adnexal regions, and pelvic cul-de-sac. Transvaginal  technique was performed to assess early pregnancy. COMPARISON:  None. FINDINGS: Intrauterine gestational sac: None Maternal uterus/adnexae: In the region of the right adnexa, there is a mass measuring approximately 5.1 x 2.5 x 4.5 cm. This mass is heterogeneous and contains a central cystic component. This mass is separate from the right ovary. Within the cystic component there is an 8 mm structure which may  represent a fetal pole. The right ovary appears relatively normal measures approximately 4.1 x 1.9 x 2.1 cm. The left ovary was not visualized. There is no significant free fluid. IMPRESSION: 1. No IUP identified. 2. Complex right adnexal mass as detailed above is concerning for a right-sided ectopic pregnancy. 3. No significant free fluid. 4. The left ovary was not well appreciated on this exam. These results were called by telephone at the time of interpretation on 02/20/2019 at 2:05 am to provider Dr. Dina Rich, who verbally acknowledged these results. Electronically Signed   By: Constance Holster M.D.   On: 02/20/2019 02:07    Assessment/Plan: Right ectopic pregnancy, appears to be in distal fallopian tube Plan: Continue n.p.o. status last meal 6 PM 02/19/2019 Surgery recommended we will proceed with laparoscopic removal of ectopic pregnancy this morning, risks potential complications such as involvement of the ovary, acute bleeding intraoperative requiring laparotomy, postsurgical complications such as infection or anesthetic complications reviewed with patient.  Tubal salvage considered unlikely.  Rare complications such as cornual pregnancy reviewed with patient which would require more extensive surgery including possibility of laparotomy or even hysterectomy reviewed with patient, Jonnie Kind 02/20/2019

## 2019-02-21 LAB — GC/CHLAMYDIA PROBE AMP (~~LOC~~) NOT AT ARMC
Chlamydia: NEGATIVE
Neisseria Gonorrhea: NEGATIVE

## 2019-02-21 LAB — SURGICAL PATHOLOGY

## 2019-03-05 ENCOUNTER — Telehealth: Payer: Self-pay | Admitting: Obstetrics & Gynecology

## 2019-03-05 NOTE — Telephone Encounter (Signed)

## 2019-03-06 ENCOUNTER — Ambulatory Visit (INDEPENDENT_AMBULATORY_CARE_PROVIDER_SITE_OTHER): Payer: 59 | Admitting: Obstetrics & Gynecology

## 2019-03-06 ENCOUNTER — Other Ambulatory Visit: Payer: Self-pay

## 2019-03-06 ENCOUNTER — Encounter: Payer: Self-pay | Admitting: Obstetrics & Gynecology

## 2019-03-06 VITALS — BP 122/71 | HR 101 | Ht 67.0 in | Wt 160.0 lb

## 2019-03-06 DIAGNOSIS — Z9889 Other specified postprocedural states: Secondary | ICD-10-CM

## 2019-03-06 NOTE — Progress Notes (Signed)
  HPI: Patient returns for routine postoperative follow-up having undergone laproscopic right salpingectomy for management of right ectopic pregnancy on 02/20/19.  The patient's immediate postoperative recovery has been unremarkable. Since hospital discharge the patient reports no problems.   Current Outpatient Medications: .  amitriptyline (ELAVIL) 75 MG tablet, Take 75 mg by mouth at bedtime. , Disp: , Rfl:  .  Lurasidone HCl (LATUDA) 60 MG TABS, Take 60 mg by mouth at bedtime., Disp: , Rfl:  .  HYDROcodone-acetaminophen (NORCO/VICODIN) 5-325 MG tablet, Take 1 tablet by mouth every 6 (six) hours as needed for moderate pain. May take with ibuprofen (Patient not taking: Reported on 03/06/2019), Disp: 15 tablet, Rfl: 0 .  ibuprofen (ADVIL) 200 MG tablet, Take 400-800 mg by mouth daily as needed for mild pain or moderate pain., Disp: , Rfl:   No current facility-administered medications for this visit.    Blood pressure 122/71, pulse (!) 101, height 5\' 7"  (1.702 m), weight 160 lb (72.6 kg), unknown if currently breastfeeding.  Physical Exam: 3 incisions all look good  Diagnostic Tests:   Pathology: Right ectopic pregnancy + tube  Impression: S/p right salpingectomy for management of right ectopic pregnancy  Plan: Pt does not want any interval BCM  Follow up: prn    Florian Buff, MD

## 2020-01-21 ENCOUNTER — Emergency Department (INDEPENDENT_AMBULATORY_CARE_PROVIDER_SITE_OTHER)
Admission: EM | Admit: 2020-01-21 | Discharge: 2020-01-21 | Disposition: A | Discharge status: Home / Self Care | Payer: 59 | Source: Home / Self Care

## 2020-01-21 ENCOUNTER — Other Ambulatory Visit: Payer: Self-pay

## 2020-01-21 ENCOUNTER — Encounter: Payer: Self-pay | Admitting: Emergency Medicine

## 2020-01-21 ENCOUNTER — Emergency Department: Admit: 2020-01-21 | Discharge status: Absent without leave | Payer: Self-pay

## 2020-01-21 DIAGNOSIS — R300 Dysuria: Secondary | ICD-10-CM | POA: Diagnosis not present

## 2020-01-21 DIAGNOSIS — R1031 Right lower quadrant pain: Secondary | ICD-10-CM

## 2020-01-21 DIAGNOSIS — R109 Unspecified abdominal pain: Secondary | ICD-10-CM

## 2020-01-21 DIAGNOSIS — R1030 Lower abdominal pain, unspecified: Secondary | ICD-10-CM

## 2020-01-21 LAB — POCT URINALYSIS DIP (MANUAL ENTRY)
Bilirubin, UA: NEGATIVE
Glucose, UA: NEGATIVE mg/dL
Leukocytes, UA: NEGATIVE
Nitrite, UA: NEGATIVE
Spec Grav, UA: >=1.03 — AB (ref 1.010–1.025)
Urobilinogen, UA: 0.2 E.U./dL
pH, UA: 5.5 (ref 5.0–8.0)

## 2020-01-21 NOTE — ED Triage Notes (Addendum)
Dysuria x 1 week, low back and flank pain vaccinated

## 2020-01-21 NOTE — ED Notes (Signed)
Patient is being discharged from the Urgent Care and sent to the Emergency Department via POV w/ s/o . Per Leeroy Cha, PA-C, patient is in need of higher level of care due to abdominal pain & ned for higher level of testing. Patient is aware and verbalizes understanding of plan of care.  Vitals:   01/21/20 1929  BP: 135/84  Pulse: 89  Temp: 98.7 F (37.1 C)  SpO2: 100%

## 2020-01-21 NOTE — ED Provider Notes (Signed)
Sherri Warren CARE    CSN: [id number] Arrival date & time: 01/21/20  1910      History   Chief Complaint Chief Complaint  Patient presents with  . Dysuria    HPI Sherri Warren is a 29 y.o. female.   HPI Sherri Warren is a 29 y.o. female presenting to UC with c/o 1 week of dysuria that has started to cause lower abdominal pain, low back pain and bilateral flank pain.  Pain is aching and sore, 6/10.  Denies fever, chills, n/v/d. She has been taking Azo without relief. Last dose was yesterday. Pt still has her appendix. No hx of kidney stones.   Past Medical History:  Diagnosis Date  . Depression    doing ok  . Infection    UTI  . Migraine   . Myoclonic jerking     There are no problems to display for this patient.   Past Surgical History:  Procedure Laterality Date  . DILATION AND CURETTAGE OF UTERUS    . LAPAROSCOPIC UNILATERAL SALPINGECTOMY Right 02/20/2019   Procedure: LAPAROSCOPIC RIGHT SALPINGECTOMY WITH REMOVAL OF ECTOPIC PREGNANCY;  Surgeon: Jonnie Kind, MD;  Location: [facility];  Service: Gynecology;  Laterality: Right;  . NO PAST SURGERIES      OB History    Gravida  3   Para  1   Term  1   Preterm      AB  1   Living  1     SAB      IAB  1   Ectopic      Multiple      Live Births  1            Home Medications    Prior to Admission medications   Medication Sig Start Date End Date Taking? Authorizing Provider  amitriptyline (ELAVIL) 75 MG tablet Take 75 mg by mouth at bedtime.  01/30/19   [provider]  Lurasidone HCl (LATUDA) 60 MG TABS Take 60 mg by mouth at bedtime.    [provider]  venlafaxine (EFFEXOR) 100 MG tablet Take 100 mg by mouth 2 (two) times daily.    [provider]    Family History Family History  Problem Relation Age of Onset  . Hypertension Mother   . Heart disease Maternal Grandmother   . Hypertension Maternal Grandmother   . Diabetes Maternal Grandfather   .  Anesthesia problems Neg Hx   . Hypotension Neg Hx   . Malignant hyperthermia Neg Hx   . Pseudochol deficiency Neg Hx   . Other Neg Hx     Social History Social History   Tobacco Use  . Smoking status: Never Smoker  . Smokeless tobacco: Never Used  Substance Use Topics  . Alcohol use: No  . Drug use: No     Allergies   Sulfa antibiotics   Review of Systems Review of Systems  Constitutional: Negative for chills and fever.  Gastrointestinal: Positive for abdominal pain. Negative for diarrhea, nausea and vomiting.  Genitourinary: Positive for dysuria and flank pain. Negative for frequency, hematuria, vaginal bleeding, vaginal discharge and vaginal pain.  Musculoskeletal: Positive for back pain.     Physical Exam Triage Vital Signs ED Triage Vitals  Enc Vitals Group     BP 01/21/20 1929 135/84     Pulse Rate 01/21/20 1929 89     Resp --      Temp 01/21/20 1929 98.7 F (37.1 C)  Temp Source 01/21/20 1929 Oral     SpO2 01/21/20 1929 100 %     Weight 01/21/20 1930 155 lb (70.3 kg)     Height 01/21/20 1930 5\' 7"  (1.702 m)     Head Circumference --      Peak Flow --      Pain Score 01/21/20 1930 6     Pain Loc --      Pain Edu? --      Excl. in [place]? --    No data found.  Updated Vital Signs BP 135/84   Pulse 89   Temp 98.7 F (37.1 C) (Oral)   Ht 5\' 7"  (1.702 m)   Wt 155 lb (70.3 kg)   LMP 01/07/2020 (Approximate)   SpO2 100%   BMI 24.28 kg/m   Visual Acuity Right Eye Distance:   Left Eye Distance:   Bilateral Distance:    Right Eye Near:   Left Eye Near:    Bilateral Near:     Physical Exam Vitals and nursing note reviewed.  Constitutional:      Appearance: Normal appearance. She is well-developed and well-nourished.  HENT:     Head: Normocephalic and atraumatic.  Eyes:     Extraocular Movements: EOM normal.  Cardiovascular:     Rate and Rhythm: Normal rate and regular rhythm.  Pulmonary:     Effort: Pulmonary effort is normal. No  respiratory distress.     Breath sounds: Normal breath sounds.  Abdominal:     Palpations: Abdomen is soft.     Tenderness: There is abdominal tenderness. There is no right CVA tenderness, left CVA tenderness, guarding or rebound. Positive signs include McBurney's sign. Negative signs include Murphy's sign.    Musculoskeletal:        General: Normal range of motion.     Cervical back: Normal range of motion.  Skin:    General: Skin is warm and dry.  Neurological:     Mental Status: She is alert and oriented to person, place, and time.  Psychiatric:        Mood and Affect: Mood and affect normal.        Behavior: Behavior normal.      UC Treatments / Results  Labs (all labs ordered are listed, but only abnormal results are displayed) Labs Reviewed  POCT URINALYSIS DIP (MANUAL ENTRY) - Abnormal; Notable for the following components:      Result Value   Clarity, UA cloudy (*)    Ketones, POC UA small (15) (*)    Spec Grav, UA >=1.030 (*)    Blood, UA small (*)    Protein Ur, POC trace (*)    All other components within normal limits  URINE CULTURE    EKG   Radiology No results found.  Procedures Procedures (including critical care time)  Medications Ordered in UC Medications - No data to display  Initial Impression / Assessment and Plan / UC Course  I have reviewed the triage vital signs and the nursing notes.  Pertinent labs & imaging results that were available during my care of the patient were reviewed by me and considered in my medical decision making (see chart for details).     UA not c/w UTI at this time. Diffuse tenderness to lower abdomen Recommend further evaluation and treatment this evening in emergency department  Pt understanding and agreeable to have her boyfriend drive her to [facility] transport.   Final Clinical Impressions(s) / UC Diagnoses  Final diagnoses:  Dysuria  RLQ abdominal pain  Lower abdominal pain   Bilateral flank pain     Discharge Instructions      You have declined EMS transport, but due to your worsening symptoms and no definite UTI in urgent care, it is advised you go to the hospital this evening for further evaluation and treatment of your symptoms. Do not eat or drink anything along the way in case emergency surgery is needed.    ED Prescriptions    None     PDMP not reviewed this encounter.   Noe Gens, [place] 01/21/20 2018

## 2020-01-21 NOTE — Discharge Instructions (Signed)
  You have declined EMS transport, but due to your worsening symptoms and no definite UTI in urgent care, it is advised you go to the hospital this evening for further evaluation and treatment of your symptoms. Do not eat or drink anything along the way in case emergency surgery is needed.

## 2020-01-24 LAB — URINE CULTURE
MICRO NUMBER:: 11343112
SPECIMEN QUALITY:: ADEQUATE

## 2020-01-31 ENCOUNTER — Ambulatory Visit: Payer: 59 | Admitting: Obstetrics & Gynecology

## 2020-02-06 ENCOUNTER — Other Ambulatory Visit (HOSPITAL_COMMUNITY)
Admission: RE | Admit: 2020-02-06 | Discharge: 2020-02-06 | Disposition: A | Discharge status: Home / Self Care | Payer: 59 | Source: Ambulatory Visit | Attending: Obstetrics & Gynecology | Admitting: Obstetrics & Gynecology

## 2020-02-06 ENCOUNTER — Ambulatory Visit (INDEPENDENT_AMBULATORY_CARE_PROVIDER_SITE_OTHER): Payer: 59 | Admitting: Obstetrics & Gynecology

## 2020-02-06 ENCOUNTER — Encounter: Payer: Self-pay | Admitting: Obstetrics & Gynecology

## 2020-02-06 ENCOUNTER — Other Ambulatory Visit: Payer: Self-pay

## 2020-02-06 VITALS — BP 124/72 | HR 87 | Ht 67.0 in | Wt 154.0 lb

## 2020-02-06 DIAGNOSIS — Z124 Encounter for screening for malignant neoplasm of cervix: Secondary | ICD-10-CM

## 2020-02-06 DIAGNOSIS — N9489 Other specified conditions associated with female genital organs and menstrual cycle: Secondary | ICD-10-CM | POA: Diagnosis not present

## 2020-02-06 DIAGNOSIS — D4959 Neoplasm of unspecified behavior of other genitourinary organ: Secondary | ICD-10-CM

## 2020-02-06 NOTE — Progress Notes (Unsigned)
Pt c/o irregular periods for the past two months

## 2020-02-06 NOTE — Progress Notes (Signed)
30 y.o. Sherri Warren Single Black or African American female here for additional evaluation of adnexal mass diagnosed after being seen in the ER on 01/22/2020.  She was see due to pelvic pain and did have an e coli positive urine culture.  She has been treated for this.  While in ER, CT was obtained showed the following:  5.2 x 6.8 x 5.8 cm complex lesion in the right adnexa containing multiple septations which could represent a large ovarian cyst or cystic neoplasm cannot be excluded by this exam alone.   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   Ultrasound was also performed with the following findings:  Large complex cystic and solid lesion inseparable from the right ovary measuring approximately 6.6 x 3.4 x 5.4 cm demonstrating internal vascularity suspicious for neoplasm.   3.9 x 3.5 x 3.4 cm complex lesion adjacent to the left ovary suggestive of a teratoma..   Small amount of free fluid in the pelvis.  Pt reports pain is better but still present, R>L.  Pt underwent laparoscopic treatment 02/2019 of ectopic pregnancy.  Surgical note, ultrasound and pictures from surgery reviewed.  This was done by Dr. Glo Herring at [facility] ob/[facility].  He is now retired.  Left ovary appears mildly enlarged on images.  Ectopic was in right tube and this was removed.     She denies any urinary symptoms or bowel symptoms.  Denies nausea or vomiting as well.   Cycles are typically normal.  LMP 01/31/2020.  Pt does not recall when last pap was obtained.     Sexually active: Yes.      reports that she has never smoked. She has never used smokeless tobacco. She reports that she does not drink alcohol and does not use drugs.  Past Medical History:  Diagnosis Date   History of depression    stable 02/2020   History of UTI    Migraine    Myoclonic jerking     Past Surgical History:  Procedure  Laterality Date   DILATION AND CURETTAGE OF UTERUS     LAPAROSCOPIC UNILATERAL SALPINGECTOMY Right 02/20/2019   Procedure: LAPAROSCOPIC RIGHT SALPINGECTOMY WITH REMOVAL OF ECTOPIC PREGNANCY;  Surgeon: Jonnie Kind, MD;  Location: [facility];  Service: Gynecology;  Laterality: Right;    Current Outpatient Medications  Medication Sig Dispense Refill   amitriptyline (ELAVIL) 75 MG tablet Take 75 mg by mouth at bedtime.      Lurasidone HCl (LATUDA) 60 MG TABS Take 60 mg by mouth at bedtime.     venlafaxine (EFFEXOR) 100 MG tablet Take 100 mg by mouth 2 (two) times daily.     No current facility-administered medications for this visit.    Family History  Problem Relation Age of Onset   Hypertension Mother    Heart disease Maternal Grandmother    Hypertension Maternal Grandmother    Diabetes Maternal Grandfather    Anesthesia problems Neg Hx    Hypotension Neg Hx    Malignant hyperthermia Neg Hx    Pseudochol deficiency Neg Hx    Other Neg Hx     Review of Systems  Constitutional: Negative.   Cardiovascular: Negative.   Gastrointestinal: Negative.   Genitourinary: Positive for pelvic pain.  Psychiatric/Behavioral: Negative.     Exam:   BP 124/72    Pulse 87    Ht 5\' 7"  (1.702  m)    Wt 154 lb (69.9 kg)    LMP 01/31/2020    BMI 24.12 kg/m   Height: 5\' 7"  (170.2 cm)  General appearance: alert, cooperative and appears stated age Head: Normocephalic, without obvious abnormality, atraumatic Lungs: clear to auscultation bilaterally Heart: regular rate and rhythm Abdomen: soft, mild RLQ tenderness on deep palpation, bowel sounds normal; no masses,  no organomegaly Extremities: extremities normal, atraumatic, no cyanosis or edema Skin: Skin color, texture, turgor normal. No rashes or lesions Lymph nodes: Cervical, supraclavicular, and axillary nodes normal. No abnormal inguinal nodes palpated Neurologic: Grossly normal  Pelvic: External genitalia:  no lesions               Urethra:  normal appearing urethra with no masses, tenderness or lesions              Bartholins and Skenes: normal                 Vagina: normal appearing vagina with normal color and discharge, no lesions              Cervix: no lesions              Pap taken: Yes.   Bimanual Exam:  Uterus:  normal size, contour, position, consistency, mobility, non-tender              Adnexa: tenderness with palpation in RLQ, no discrete mass noted, no mass/non tender in LLQ  , RN, was present for exam.  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  The right adnexal mass is definitely new.  With solid/cystic component, feel additional imaging for diagnostic purposes is helpful.  D/w pt possible causes and that surgical removal is probably necessary.  Pt understands importance of additional testing. - CA 125 - MR PELVIS W WO CONTRAST; Future  2. Cervical cancer screening - Cytology - PAP( [place])  23 minutes of total time was spent for this patient encounter, including preparation, face-to-face counseling with the patient and coordination of care, and documentation of the encounter.

## 2020-02-07 ENCOUNTER — Encounter: Payer: Self-pay | Admitting: Obstetrics & Gynecology

## 2020-02-07 DIAGNOSIS — D4959 Neoplasm of unspecified behavior of other genitourinary organ: Secondary | ICD-10-CM | POA: Insufficient documentation

## 2020-02-07 LAB — CA 125: CA 125: 12 U/mL (ref <35)

## 2020-02-08 LAB — CYTOLOGY - PAP
Diagnosis: NEGATIVE
Diagnosis: REACTIVE

## 2020-02-25 ENCOUNTER — Other Ambulatory Visit: Payer: Self-pay

## 2020-02-25 ENCOUNTER — Ambulatory Visit (INDEPENDENT_AMBULATORY_CARE_PROVIDER_SITE_OTHER): Payer: 59

## 2020-02-25 DIAGNOSIS — D4959 Neoplasm of unspecified behavior of other genitourinary organ: Secondary | ICD-10-CM

## 2020-02-25 DIAGNOSIS — D271 Benign neoplasm of left ovary: Secondary | ICD-10-CM

## 2020-02-25 DIAGNOSIS — N9489 Other specified conditions associated with female genital organs and menstrual cycle: Secondary | ICD-10-CM

## 2020-02-25 MED ORDER — GADOBUTROL 1 MMOL/ML IV SOLN
6.9000 mL | Freq: Once | INTRAVENOUS | Status: AC | PRN
  Administered 2020-02-25: 6.9 mL via INTRAVENOUS

## 2020-02-26 ENCOUNTER — Encounter: Payer: Self-pay | Admitting: Obstetrics & Gynecology

## 2020-03-01 ENCOUNTER — Other Ambulatory Visit: Payer: Self-pay | Admitting: Obstetrics & Gynecology

## 2020-04-11 ENCOUNTER — Encounter (HOSPITAL_BASED_OUTPATIENT_CLINIC_OR_DEPARTMENT_OTHER): Payer: Self-pay

## 2020-04-17 ENCOUNTER — Encounter (HOSPITAL_BASED_OUTPATIENT_CLINIC_OR_DEPARTMENT_OTHER): Payer: Self-pay | Admitting: Obstetrics & Gynecology

## 2020-04-17 ENCOUNTER — Other Ambulatory Visit: Payer: Self-pay

## 2020-04-17 NOTE — Progress Notes (Signed)
Spoke w/ via phone for pre-op interview---pt Lab needs dos----    none           Lab results------has lab appt 04-21-2020 915 am for cbc t & S serum preg COVID test -----04-21-2020 1030- Arrive at -------530 am NPO after MN NO Solid Food.  Clear liquids from MN until---430 am then npo Med rec completed Medications to take morning of surgery -----venlafaxine Diabetic medication -----n/a Patient instructed to bring photo id and insurance card day of surgery Patient aware to have Driver (ride ) / caregiver boyfriendkyle Warren will stay    for 24 hours after surgery  Patient Special Instructions -----pt given overnight stay instructions Pre-Op special Istructions -----n/a Patient verbalized understanding of instructions that were given at this phone interview. Patient denies shortness of breath, chest pain, fever, cough at this phone interview.

## 2020-04-21 ENCOUNTER — Other Ambulatory Visit (HOSPITAL_COMMUNITY)
Admission: RE | Admit: 2020-04-21 | Discharge: 2020-04-21 | Disposition: A | Discharge status: Home / Self Care | Payer: 59 | Source: Ambulatory Visit | Attending: Obstetrics & Gynecology | Admitting: Obstetrics & Gynecology

## 2020-04-21 ENCOUNTER — Other Ambulatory Visit (HOSPITAL_COMMUNITY): Payer: 59

## 2020-04-21 ENCOUNTER — Other Ambulatory Visit: Payer: Self-pay

## 2020-04-21 ENCOUNTER — Encounter (HOSPITAL_COMMUNITY)
Admission: RE | Admit: 2020-04-21 | Discharge: 2020-04-21 | Disposition: A | Discharge status: Home / Self Care | Payer: 59 | Source: Ambulatory Visit | Attending: Obstetrics & Gynecology | Admitting: Obstetrics & Gynecology

## 2020-04-21 DIAGNOSIS — Z20822 Contact with and (suspected) exposure to covid-19: Secondary | ICD-10-CM | POA: Insufficient documentation

## 2020-04-21 DIAGNOSIS — Z01812 Encounter for preprocedural laboratory examination: Secondary | ICD-10-CM | POA: Insufficient documentation

## 2020-04-21 LAB — CBC
HCT: 42.6 % (ref 36.0–46.0)
Hemoglobin: 13.9 g/dL (ref 12.0–15.0)
MCH: 32 pg (ref 26.0–34.0)
MCHC: 32.6 g/dL (ref 30.0–36.0)
MCV: 97.9 fL (ref 80.0–100.0)
Platelets: 211 10*3/uL (ref 150–400)
RBC: 4.35 MIL/uL (ref 3.87–5.11)
RDW: 13.2 % (ref 11.5–15.5)
WBC: 6 10*3/uL (ref 4.0–10.5)
nRBC: 0 % (ref 0.0–0.2)

## 2020-04-21 LAB — HCG, SERUM, QUALITATIVE: Preg, Serum: NEGATIVE

## 2020-04-21 NOTE — Progress Notes (Signed)
Patient heart rate 105- 110 at pre op, pt states this is her usual heart rate and pt denies any cardiac S & S.

## 2020-04-22 LAB — SARS CORONAVIRUS 2 (TAT 6-24 HRS): SARS Coronavirus 2: NEGATIVE

## 2020-04-22 NOTE — Anesthesia Preprocedure Evaluation (Addendum)
Anesthesia Evaluation  Patient identified by MRN, date of birth, ID band Patient awake    Reviewed: Allergy & Precautions, NPO status , Patient's Chart, lab work & pertinent test results  Airway Mallampati: II  TM Distance: >3 FB Neck ROM: Full    Dental  (+) Teeth Intact   Pulmonary neg pulmonary ROS,    Pulmonary exam normal        Cardiovascular negative cardio ROS   Rhythm:Regular Rate:Normal     Neuro/Psych Depression negative neurological ROS     GI/Hepatic negative GI ROS, Neg liver ROS,   Endo/Other  negative endocrine ROS  Renal/GU negative Renal ROS  Female GU complaint Dermoid cyst    Musculoskeletal negative musculoskeletal ROS (+)   Abdominal (+)  Abdomen: soft. Bowel sounds: normal.  Peds  Hematology negative hematology ROS (+)   Anesthesia Other Findings   Reproductive/Obstetrics negative OB ROS                            Anesthesia Physical Anesthesia Plan  ASA: II  Anesthesia Plan: General   Post-op Pain Management:    Induction: Intravenous  PONV Risk Score and Plan: 3 and Ondansetron, Dexamethasone, Midazolam and Treatment may vary due to age or medical condition  Airway Management Planned: Mask and Oral ETT  Additional Equipment: None  Intra-op Plan:   Post-operative Plan: Extubation in OR  Informed Consent: I have reviewed the patients History and Physical, chart, labs and discussed the procedure including the risks, benefits and alternatives for the proposed anesthesia with the patient or authorized representative who has indicated his/her understanding and acceptance.     Dental advisory given  Plan Discussed with: CRNA  Anesthesia Plan Comments: (Lab Results      Component                Value               Date                      WBC                      6.0                 04/21/2020                HGB                      13.9                 04/21/2020                HCT                      42.6                04/21/2020                MCV                      97.9                04/21/2020                PLT  211                 04/21/2020           Lab Results      Component                Value               Date                      NA                       139                 02/19/2019                K                        4.2                 02/19/2019                GLUCOSE                  79                  02/19/2019                BUN                      8                   02/19/2019                CREATININE               0.80                02/19/2019           Lab Results      Component                Value               Date                      PREGTESTUR               POSITIVE (A)        02/19/2019                PREGSERUM                NEGATIVE            04/21/2020                HCG                      >2,000.0 (H)        02/19/2019          )       Anesthesia Quick Evaluation

## 2020-04-23 ENCOUNTER — Encounter (HOSPITAL_BASED_OUTPATIENT_CLINIC_OR_DEPARTMENT_OTHER): Payer: Self-pay | Admitting: Obstetrics & Gynecology

## 2020-04-23 ENCOUNTER — Encounter (HOSPITAL_BASED_OUTPATIENT_CLINIC_OR_DEPARTMENT_OTHER)
Admission: RE | Disposition: A | Discharge status: Home / Self Care | Payer: Self-pay | Source: Ambulatory Visit | Attending: Obstetrics & Gynecology

## 2020-04-23 ENCOUNTER — Other Ambulatory Visit: Payer: Self-pay

## 2020-04-23 ENCOUNTER — Ambulatory Visit (HOSPITAL_BASED_OUTPATIENT_CLINIC_OR_DEPARTMENT_OTHER): Payer: 59 | Admitting: Anesthesiology

## 2020-04-23 ENCOUNTER — Ambulatory Visit (HOSPITAL_BASED_OUTPATIENT_CLINIC_OR_DEPARTMENT_OTHER)
Admission: RE | Admit: 2020-04-23 | Discharge: 2020-04-23 | Disposition: A | Discharge status: Home / Self Care | Payer: 59 | Source: Ambulatory Visit | Attending: Obstetrics & Gynecology | Admitting: Obstetrics & Gynecology

## 2020-04-23 DIAGNOSIS — Z882 Allergy status to sulfonamides status: Secondary | ICD-10-CM | POA: Insufficient documentation

## 2020-04-23 DIAGNOSIS — Z79899 Other long term (current) drug therapy: Secondary | ICD-10-CM | POA: Diagnosis not present

## 2020-04-23 DIAGNOSIS — D271 Benign neoplasm of left ovary: Secondary | ICD-10-CM

## 2020-04-23 DIAGNOSIS — Z90721 Acquired absence of ovaries, unilateral: Secondary | ICD-10-CM | POA: Diagnosis not present

## 2020-04-23 HISTORY — PX: LAPAROSCOPIC OVARIAN CYSTECTOMY: SHX6248

## 2020-04-23 HISTORY — DX: Personal history of other diseases of the nervous system and sense organs: Z86.69

## 2020-04-23 HISTORY — DX: Other specified conditions associated with female genital organs and menstrual cycle: N94.89

## 2020-04-23 HISTORY — DX: Presence of spectacles and contact lenses: Z97.3

## 2020-04-23 LAB — TYPE AND SCREEN
ABO/RH(D): B POS
Antibody Screen: NEGATIVE

## 2020-04-23 SURGERY — EXCISION, CYST, OVARY, LAPAROSCOPIC
Anesthesia: General | Site: Abdomen | Laterality: Left

## 2020-04-23 MED ORDER — ACETAMINOPHEN 500 MG PO TABS
1000.0000 mg | ORAL_TABLET | ORAL | Status: AC
  Administered 2020-04-23: 1000 mg via ORAL

## 2020-04-23 MED ORDER — OXYCODONE HCL 5 MG PO TABS
5.0000 mg | ORAL_TABLET | Freq: Once | ORAL | Status: AC | PRN
  Administered 2020-04-23: 5 mg via ORAL

## 2020-04-23 MED ORDER — ROCURONIUM BROMIDE 10 MG/ML (PF) SYRINGE
PREFILLED_SYRINGE | INTRAVENOUS | Status: AC
  Filled 2020-04-23: qty 10

## 2020-04-23 MED ORDER — ONDANSETRON HCL 4 MG/2ML IJ SOLN
INTRAMUSCULAR | Status: DC | PRN
  Administered 2020-04-23: 4 mg via INTRAVENOUS

## 2020-04-23 MED ORDER — FENTANYL CITRATE (PF) 100 MCG/2ML IJ SOLN
INTRAMUSCULAR | Status: AC
  Filled 2020-04-23: qty 2

## 2020-04-23 MED ORDER — DEXAMETHASONE SODIUM PHOSPHATE 10 MG/ML IJ SOLN
INTRAMUSCULAR | Status: AC
  Filled 2020-04-23: qty 1

## 2020-04-23 MED ORDER — FENTANYL CITRATE (PF) 100 MCG/2ML IJ SOLN: 25.0000 ug | INTRAMUSCULAR | Status: DC | PRN

## 2020-04-23 MED ORDER — AMISULPRIDE (ANTIEMETIC) 5 MG/2ML IV SOLN: 10.0000 mg | Freq: Once | INTRAVENOUS | Status: DC | PRN

## 2020-04-23 MED ORDER — SODIUM CHLORIDE 0.9 % IR SOLN
Status: DC | PRN
  Administered 2020-04-23: 1000 mL

## 2020-04-23 MED ORDER — LACTATED RINGERS IV SOLN: INTRAVENOUS | Status: DC

## 2020-04-23 MED ORDER — BUPIVACAINE HCL 0.25 % IJ SOLN
INTRAMUSCULAR | Status: DC | PRN
  Administered 2020-04-23: 7 mL

## 2020-04-23 MED ORDER — SOD CITRATE-CITRIC ACID 500-334 MG/5ML PO SOLN: 30.0000 mL | ORAL | Status: DC

## 2020-04-23 MED ORDER — SUGAMMADEX SODIUM 200 MG/2ML IV SOLN
INTRAVENOUS | Status: DC | PRN
  Administered 2020-04-23: 200 mg via INTRAVENOUS

## 2020-04-23 MED ORDER — PROPOFOL 10 MG/ML IV BOLUS
INTRAVENOUS | Status: DC | PRN
  Administered 2020-04-23: 40 mg via INTRAVENOUS
  Administered 2020-04-23: 160 mg via INTRAVENOUS

## 2020-04-23 MED ORDER — OXYCODONE HCL 5 MG PO TABS
ORAL_TABLET | ORAL | Status: AC
  Filled 2020-04-23: qty 1

## 2020-04-23 MED ORDER — MIDAZOLAM HCL 2 MG/2ML IJ SOLN
INTRAMUSCULAR | Status: AC
  Filled 2020-04-23: qty 2

## 2020-04-23 MED ORDER — ROCURONIUM BROMIDE 10 MG/ML (PF) SYRINGE
PREFILLED_SYRINGE | INTRAVENOUS | Status: DC | PRN
  Administered 2020-04-23: 50 mg via INTRAVENOUS

## 2020-04-23 MED ORDER — IBUPROFEN 800 MG PO TABS
800.0000 mg | ORAL_TABLET | Freq: Three times a day (TID) | ORAL | 0 refills | Status: DC | PRN
Start: 2020-04-23 — End: 2020-04-30

## 2020-04-23 MED ORDER — PROPOFOL 500 MG/50ML IV EMUL
INTRAVENOUS | Status: AC
  Filled 2020-04-23: qty 50

## 2020-04-23 MED ORDER — KETOROLAC TROMETHAMINE 30 MG/ML IJ SOLN
INTRAMUSCULAR | Status: DC | PRN
  Administered 2020-04-23: 30 mg via INTRAVENOUS

## 2020-04-23 MED ORDER — DEXAMETHASONE SODIUM PHOSPHATE 10 MG/ML IJ SOLN
INTRAMUSCULAR | Status: DC | PRN
  Administered 2020-04-23: 10 mg via INTRAVENOUS

## 2020-04-23 MED ORDER — ONDANSETRON HCL 4 MG/2ML IJ SOLN
INTRAMUSCULAR | Status: AC
  Filled 2020-04-23: qty 2

## 2020-04-23 MED ORDER — FENTANYL CITRATE (PF) 100 MCG/2ML IJ SOLN
INTRAMUSCULAR | Status: DC | PRN
  Administered 2020-04-23: 50 ug via INTRAVENOUS
  Administered 2020-04-23: 100 ug via INTRAVENOUS
  Administered 2020-04-23: 50 ug via INTRAVENOUS

## 2020-04-23 MED ORDER — LIDOCAINE 2% (20 MG/ML) 5 ML SYRINGE
INTRAMUSCULAR | Status: DC | PRN
  Administered 2020-04-23: 60 mg via INTRAVENOUS

## 2020-04-23 MED ORDER — HYDROCODONE-ACETAMINOPHEN 5-325 MG PO TABS: 1.0000 | ORAL_TABLET | Freq: Four times a day (QID) | ORAL | 0 refills | Status: DC | PRN

## 2020-04-23 MED ORDER — PROMETHAZINE HCL 25 MG/ML IJ SOLN: 6.2500 mg | INTRAMUSCULAR | Status: DC | PRN

## 2020-04-23 MED ORDER — LIDOCAINE 2% (20 MG/ML) 5 ML SYRINGE
INTRAMUSCULAR | Status: AC
  Filled 2020-04-23: qty 5

## 2020-04-23 MED ORDER — POVIDONE-IODINE 10 % EX SWAB: 2.0000 "application " | Freq: Once | CUTANEOUS | Status: DC

## 2020-04-23 MED ORDER — OXYCODONE HCL 5 MG/5ML PO SOLN: 5.0000 mg | Freq: Once | ORAL | Status: AC | PRN

## 2020-04-23 MED ORDER — MIDAZOLAM HCL 2 MG/2ML IJ SOLN
INTRAMUSCULAR | Status: DC | PRN
  Administered 2020-04-23: 2 mg via INTRAVENOUS

## 2020-04-23 MED ORDER — ACETAMINOPHEN 500 MG PO TABS
ORAL_TABLET | ORAL | Status: AC
  Filled 2020-04-23: qty 2

## 2020-04-23 SURGICAL SUPPLY — 68 items
ADH SKN CLS APL DERMABOND .7 (GAUZE/BANDAGES/DRESSINGS) ×1
APL SKNCLS STERI-STRIP NONHPOA (GAUZE/BANDAGES/DRESSINGS)
APL SRG 38 LTWT LNG FL B (MISCELLANEOUS)
APPLICATOR ARISTA FLEXITIP XL (MISCELLANEOUS) IMPLANT
BAG RETRIEVAL 10 (BASKET) ×1
BARRIER ADHS 3X4 INTERCEED (GAUZE/BANDAGES/DRESSINGS) ×2 IMPLANT
BENZOIN TINCTURE PRP APPL 2/3 (GAUZE/BANDAGES/DRESSINGS) IMPLANT
BLADE CLIPPER SENSICLIP SURGIC (BLADE) IMPLANT
BRR ADH 4X3 ABS CNTRL BYND (GAUZE/BANDAGES/DRESSINGS) ×1
CABLE HIGH FREQUENCY MONO STRZ (ELECTRODE) IMPLANT
CANISTER SUCT 1200ML W/VALVE (MISCELLANEOUS) IMPLANT
CANISTER SUCT 3000ML PPV (MISCELLANEOUS) IMPLANT
CATH ROBINSON RED A/P 16FR (CATHETERS) IMPLANT
COVER MAYO STAND STRL (DRAPES) ×2 IMPLANT
COVER WAND RF STERILE (DRAPES) ×2 IMPLANT
DECANTER SPIKE VIAL GLASS SM (MISCELLANEOUS) ×2 IMPLANT
DERMABOND ADVANCED (GAUZE/BANDAGES/DRESSINGS) ×1
DERMABOND ADVANCED .7 DNX12 (GAUZE/BANDAGES/DRESSINGS) ×1 IMPLANT
DRSG COVADERM PLUS 2X2 (GAUZE/BANDAGES/DRESSINGS) IMPLANT
DRSG OPSITE POSTOP 3X4 (GAUZE/BANDAGES/DRESSINGS) IMPLANT
DRSG TELFA 3X8 NADH (GAUZE/BANDAGES/DRESSINGS) IMPLANT
DURAPREP 26ML APPLICATOR (WOUND CARE) ×2 IMPLANT
GAUZE 4X4 16PLY RFD (DISPOSABLE) ×4 IMPLANT
GLOVE SURG LTX SZ6.5 (GLOVE) ×4 IMPLANT
GLOVE SURG UNDER POLY LF SZ7 (GLOVE) ×6 IMPLANT
GOWN STRL REUS W/TWL LRG LVL3 (GOWN DISPOSABLE) ×4 IMPLANT
HEMOSTAT ARISTA ABSORB 3G PWDR (HEMOSTASIS) IMPLANT
KIT TURNOVER CYSTO (KITS) ×2 IMPLANT
LIGASURE VESSEL 5MM BLUNT TIP (ELECTROSURGICAL) IMPLANT
NEEDLE HYPO 25X1 1.5 SAFETY (NEEDLE) ×2 IMPLANT
NEEDLE INSUFFLATION 120MM (ENDOMECHANICALS) ×2 IMPLANT
NEEDLE INSUFFLATION 14GA 150MM (NEEDLE) IMPLANT
NS IRRIG 500ML POUR BTL (IV SOLUTION) ×2 IMPLANT
PACK LAPAROSCOPY BASIN (CUSTOM PROCEDURE TRAY) ×2 IMPLANT
PACK TRENDGUARD 450 HYBRID PRO (MISCELLANEOUS) ×1 IMPLANT
PAD OB MATERNITY 4.3X12.25 (PERSONAL CARE ITEMS) ×2 IMPLANT
POUCH LAPAROSCOPIC INSTRUMENT (MISCELLANEOUS) ×2 IMPLANT
PROTECTOR NERVE ULNAR (MISCELLANEOUS) ×2 IMPLANT
SCISSORS LAP 5X35 DISP (ENDOMECHANICALS) IMPLANT
SEALER TISSUE G2 CVD JAW 35 (ENDOMECHANICALS) IMPLANT
SEALER TISSUE G2 CVD JAW 45CM (ENDOMECHANICALS)
SET SUCTION IRRIG HYDROSURG (IRRIGATION / IRRIGATOR) IMPLANT
SET TUBE SMOKE EVAC HIGH FLOW (TUBING) ×2 IMPLANT
SHEARS HARMONIC ACE PLUS 36CM (ENDOMECHANICALS) ×2 IMPLANT
SLEEVE ADV FIXATION 5X100MM (TROCAR) ×2 IMPLANT
SOLUTION ELECTROLUBE (MISCELLANEOUS) IMPLANT
STRIP CLOSURE SKIN 1/4X4 (GAUZE/BANDAGES/DRESSINGS) IMPLANT
SUT VIC AB 4-0 PS2 18 (SUTURE) ×2 IMPLANT
SUT VIC AB 4-0 SH 27 (SUTURE)
SUT VIC AB 4-0 SH 27XANBCTRL (SUTURE) IMPLANT
SUT VICRYL 0 UR6 27IN ABS (SUTURE) ×2 IMPLANT
SYR 10ML LL (SYRINGE) ×2 IMPLANT
SYR 30ML LL (SYRINGE) IMPLANT
SYR 3ML 23GX1 SAFETY (SYRINGE) IMPLANT
SYS BAG RETRIEVAL 10MM (BASKET) ×1
SYSTEM BAG RETRIEVAL 10MM (BASKET) ×1 IMPLANT
SYSTEM CARTER THOMASON II (TROCAR) ×2 IMPLANT
TOWEL OR 17X26 10 PK STRL BLUE (TOWEL DISPOSABLE) ×2 IMPLANT
TRAP SPECIMEN MUCUS 40CC (MISCELLANEOUS) ×2 IMPLANT
TRAY FOLEY W/BAG SLVR 14FR LF (SET/KITS/TRAYS/PACK) ×2 IMPLANT
TRENDGUARD 450 HYBRID PRO PACK (MISCELLANEOUS) ×2
TROCAR ADV FIXATION 5X100MM (TROCAR) ×2 IMPLANT
TROCAR BLADELESS OPT 5 100 (ENDOMECHANICALS) ×2 IMPLANT
TROCAR XCEL NON BLADE 8MM B8LT (ENDOMECHANICALS) IMPLANT
TROCAR XCEL NON-BLD 11X100MML (ENDOMECHANICALS) ×2 IMPLANT
TUBE CONNECTING 12X1/4 (SUCTIONS) IMPLANT
WARMER LAPAROSCOPE (MISCELLANEOUS) ×2 IMPLANT
WATER STERILE IRR 500ML POUR (IV SOLUTION) ×2 IMPLANT

## 2020-04-23 NOTE — Anesthesia Procedure Notes (Signed)
Procedure Name: Intubation Date/Time: 04/23/2020 7:44 AM Performed by: Genelle Bal, CRNA Pre-anesthesia Checklist: Patient identified, Emergency Drugs available, Suction available and Patient being monitored Patient Re-evaluated:Patient Re-evaluated prior to induction Oxygen Delivery Method: Circle system utilized Preoxygenation: Pre-oxygenation with 100% oxygen Induction Type: IV induction Ventilation: Mask ventilation without difficulty Laryngoscope Size: Miller and 2 Grade View: Grade I Tube type: Oral Tube size: 7.0 mm Number of attempts: 1 Airway Equipment and Method: Stylet Placement Confirmation: ETT inserted through vocal cords under direct vision,  positive ETCO2 and breath sounds checked- equal and bilateral Secured at: 21 cm Tube secured with: Tape Dental Injury: Teeth and Oropharynx as per pre-operative assessment

## 2020-04-23 NOTE — H&P (Signed)
Sherri Warren is an 30 y.o. female [phone] AA here for laparoscopic removal of left ovarian dermoid.  This was noted on CT in December at [facility].  Also, a right 5.2cm cystic lesion was noted at well.  These were confirmed with ultrasound.  Follow up MRI showed resolution of the right mass with continue 3.2cm left dermoid cyst.  Due to size, removal recommended.  Conservative management with monitoring is possible but not recommended.  Risks of DVT, PE, infection, bleeding, transfusion, injury to surrounding structures reviewed.  Pt here and ready to proceed.  Pertinent Gynecological History: Menses: normal Contraception: none DES exposure: denies Blood transfusions: none Sexually transmitted diseases: no past history Previous GYN Procedures: laparoscopic for ectopic pregnancy  Last mammogram: n/a Last pap: normal Date: 1/5/25022   Menstrual History: Patient's last menstrual period was 04/08/2020.    Past Medical History:  Diagnosis Date  . Adnexal mass   . History of depression    stable 02/2020  . History of migraine none in years  . History of UTI   . Myoclonic jerking issue resolved 8 yrs ago per pt  . Polycystic ovary    right ovary, noted on MRI  . Wears glasses     Past Surgical History:  Procedure Laterality Date  . DILATION AND CURETTAGE OF UTERUS  2017  . LAPAROSCOPIC UNILATERAL SALPINGECTOMY Right 02/20/2019   Procedure: LAPAROSCOPIC RIGHT SALPINGECTOMY WITH REMOVAL OF ECTOPIC PREGNANCY;  Surgeon: Jonnie Kind, MD;  Location: [facility];  Service: Gynecology;  Laterality: Right;  . WISDOM TOOTH EXTRACTION  2010    Family History  Problem Relation Age of Onset  . Hypertension Mother   . Heart disease Maternal Grandmother   . Hypertension Maternal Grandmother   . Diabetes Maternal Grandfather   . Anesthesia problems Neg Hx   . Hypotension Neg Hx   . Malignant hyperthermia Neg Hx   . Pseudochol deficiency Neg Hx   . Other Neg Hx     Social History:  reports  that she has never smoked. She has never used smokeless tobacco. She reports that she does not drink alcohol and does not use drugs.  Allergies:  Allergies  Allergen Reactions  . Sulfa Antibiotics Rash    Medications Prior to Admission  Medication Sig Dispense Refill Last Dose  . amitriptyline (ELAVIL) 75 MG tablet Take 75 mg by mouth at bedtime.    04/22/2020 at Unknown time  . Lurasidone HCl (LATUDA) 60 MG TABS Take 60 mg by mouth at bedtime.   Past Week at Unknown time  . venlafaxine (EFFEXOR) 100 MG tablet Take 75 mg by mouth 2 (two) times daily.   04/22/2020 at Unknown time    Review of Systems  All other systems reviewed and are negative.   Blood pressure 131/90, pulse (!) 111, temperature 98.5 F (36.9 C), temperature source Oral, resp. rate 16, height 5\' 7"  (1.702 m), weight 66.5 kg, last menstrual period 04/08/2020, SpO2 100 %, unknown if currently breastfeeding. Physical Exam Vitals reviewed.  Cardiovascular:     Rate and Rhythm: Normal rate and regular rhythm.     Heart sounds: Normal heart sounds.  Pulmonary:     Effort: Pulmonary effort is normal.     Breath sounds: Normal breath sounds. Stridor present.  Abdominal:     General: Abdomen is flat.     Palpations: Abdomen is soft.  Musculoskeletal:     Cervical back: Normal range of motion and neck supple.  Neurological:  General: No focal deficit present.  Psychiatric:        Mood and Affect: Mood normal.        Behavior: Behavior normal.     No results found for this or any previous visit (from the past 24 hour(s)).  No results found.  Assessment/Plan: 30 yo [id number] SAAF with left ovarian dermoid here for laparoscopic removal.  Risks reviewed.  Pt here and ready to proceed.  [name] 04/23/2020, 7:15 AM

## 2020-04-23 NOTE — Discharge Instructions (Addendum)
Post-surgical Instructions, Outpatient Surgery  You may expect to feel dizzy, weak, and drowsy for as long as 24 hours after receiving the medicine that made you sleep (anesthetic). For the first 24 hours after your surgery:    Do not drive a car, ride a bicycle, participate in physical activities, or take public transportation until you are done taking narcotic pain medicines or as directed by Dr. Sabra Heck.   Do not drink alcohol or take tranquilizers.   Do not take medicine that has not been prescribed by your physicians.   Do not sign important papers or make important decisions while on narcotic pain medicines.   Have a responsible person with you.   CARE OF INCISION  If you have a bandage, you may remove it in one day.  If there are steri-strips or dermabond, just let this loosen on its own.   You may shower on the first day after your surgery.  Do not sit in a tub bath for one week.  Avoid heavy lifting (more than 10 pounds/4.5 kilograms), pushing, or pulling.   Avoid activities that may risk injury to your incisions.   PAIN MANAGEMENT  Motrin 800mg .  (This is the same as 4-200mg  over the counter tablets of Motrin or ibuprofen.)  You may take this every eight hours or as needed for cramping.    Vicodin 5/325mg .  For more severe pain, take one or two tablets every four to six hours as needed for pain control.  (Remember that narcotic pain medications increase your risk of constipation.  If this becomes a problem, you may take an over the counter stool softener like Colace 100mg  up to four times a day.)  DO'S AND DON'T'S  Do not take a tub bath for one week.  You may shower on the first day after your surgery  Do not do any heavy lifting for one week.  This increases the chance of bleeding.  Do move around as you feel able.  Stairs are fine.  You may begin to exercise again as you feel able.  Do not lift any weights for two weeks.  Do not put anything in the vagina for two  weeks--no tampons, intercourse, or douching.    REGULAR MEDIATIONS/VITAMINS:  You may restart all of your regular medications as prescribed.  You may restart all of your vitamins as you normally take them.    PLEASE CALL OR SEEK MEDICAL CARE IF:  You have persistent nausea and vomiting.   You have trouble eating or drinking.   You have an oral temperature above 100.5.   You have constipation that is not helped by adjusting diet or increasing fluid intake. Pain medicines are a common cause of constipation.   You have heavy vaginal bleeding  You have redness or drainage from your incision(s) or there is increasing pain or tenderness near or in the surgical site.    Post Anesthesia Home Care Instructions  Activity: Get plenty of rest for the remainder of the day. A responsible individual must stay with you for 24 hours following the procedure.  For the next 24 hours, DO NOT: -Drive a car -Paediatric nurse -Drink alcoholic beverages -Take any medication unless instructed by your physician -Make any legal decisions or sign important papers.  Meals: Start with liquid foods such as gelatin or soup. Progress to regular foods as tolerated. Avoid greasy, spicy, heavy foods. If nausea and/or vomiting occur, drink only clear liquids until the nausea and/or vomiting subsides. Call your  physician if vomiting continues.  Special Instructions/Symptoms: Your throat may feel dry or sore from the anesthesia or the breathing tube placed in your throat during surgery. If this causes discomfort, gargle with warm salt water. The discomfort should disappear within 24 hours.  PLEASE PAGE ME AT [phone] IF YOU HAVE ANY AFTER HOURS CONCERNS.

## 2020-04-23 NOTE — Anesthesia Postprocedure Evaluation (Signed)
Anesthesia Post Note  Patient: Sherri Warren  Procedure(s) Performed: LAPAROSCOPIC OVARIAN CYSTECTOMY, PERITONEAL BIOPSY, FULGERATION OF ENDOMETRIOSIS (Left Abdomen)     Patient location during evaluation: PACU Anesthesia Type: General Level of consciousness: awake and alert Pain management: pain level controlled Vital Signs Assessment: post-procedure vital signs reviewed and stable Respiratory status: spontaneous breathing, nonlabored ventilation, respiratory function stable and patient connected to nasal cannula oxygen Cardiovascular status: blood pressure returned to baseline and stable Postop Assessment: no apparent nausea or vomiting Anesthetic complications: no   No complications documented.  Last Vitals:  Vitals:   04/23/20 1000 04/23/20 1055  BP: 130/74 112/66  Pulse: (!) 108 (!) 108  Resp: (!) 21 16  Temp:  37 C  SpO2: 100% 100%    Last Pain:  Vitals:   04/23/20 1055  TempSrc:   PainSc: 5                  Farhan Jean P Arav Bannister

## 2020-04-23 NOTE — Op Note (Signed)
04/23/2020  9:06 AM  PATIENT:  Sherri Warren  30 y.o. female  PRE-OPERATIVE DIAGNOSIS: Left dermoid Cyst  POST-OPERATIVE DIAGNOSIS: Left dermoid Cyst, endometriosis, stage 2  PROCEDURE:  Procedure(s): LAPAROSCOPIC OVARIAN CYSTECTOMY  SURGEON:  [name]  ASSISTANTS: Dr. Darron Doom.  An experienced assistant was required given the standard of surgical care given the complexity of the case.  This assistant was needed for exposure, dissection, suctioning, retraction, instrument exchange and for overall help during the procedure.  RNFA help was also unavailable.  ANESTHESIA:   general  ESTIMATED BLOOD LOSS: 25 mL  BLOOD ADMINISTERED:none   FLUIDS: 900cc LR  UOP: 200cc clear UOP  SPECIMEN:  Left ovarian dermoid, removed without rupture, cytologic washings, right peritoneal biopsy  DISPOSITION OF SPECIMEN:  PATHOLOGY  FINDINGS: enlarged left ovary, normal left tube, normal right ovary, absent left fallopian tube due to prior ectopic pregnancy, mild colon adhesions on the right side, normal upper abdomen, 6 areas of endometriosis, 4 superficial and 2 deeper regions  DESCRIPTION OF OPERATION: Patient is taken to the operating room. She is placed in the supine position. She is a running IV in place. Informed consent was present on the chart. SCDs on her lower extremities and functioning properly. Patient was positioned while she was awake.  Her legs were placed in the low lithotomy position in [place]. Her arms were tucked by the side.  General endotracheal anesthesia was administered by the anesthesia staff without difficulty. Dr. Gloris Manchester, anesthesia, oversaw case.  Time out performed.    Chlora prep was then used to prep the abdomen and Betadine was used to prep the inner thighs, perineum and vagina. Once 3 minutes had past the patient was draped in a normal standard fashion. The legs were lifted to the high lithotomy position. The cervix was visualized by placing a  bivalved speculum in the vagina. The anterior lip of the cervix was grasped with single-tooth tenaculum.  An acorn uterine manipulator was passed into the cervical os and attached to the tenaculum as a means of manipulating the uterus during the procedure.  The speculum was removed. A Foley catheter was placed to straight drain.  Clear urine was noted. Legs were lowered to the low lithotomy position and attention was turned the abdomen.  The umbilicus was everted.  A Veress needle was obtained. Syringe of sterile saline was placed on a open Veress needle.  This was passed into the umbilicus until just when the fluid started to drip.  Then low flow CO2 gas was attached the needle and the pneumoperitoneum was achieved without difficulty. Once 3.4L of gas was in the abdomen the Veress needle was removed and a 5 millimeter non-bladed Optiview trocar and port were passed directly to the abdomen. The laparoscope was then used to confirm intraperitoneal placement. Findings noted above.  Locations for RLQ and LLQ ports were noted by transillumination of the abdominal wall.  0.25% marcaine was used to anesthetize the skin.  37mm skin incision was made in the LLQ and a 10-11 non bladed trochar and port was placed underdirect visualization of the laparoscope.  Then a 3mm skin incision was made and a 74mm nonbladed trochar and port was placed in the RLQ.  All trochars were removed.    Ureters were identifies.  Attention was turned to the left side. The ovary was elevated by the uteroovarian pedicle.  Incision made across the ovary with a harmonic scalpel.  The dermoid was shelled out using traction, counter traction  and the harmonic scalpel when needed.  Once the dermoid was freed, it was placed in a laparoscopic bag and brought up to the skin.  The dermoid was opened in the bag.  Hair, fat and a firm nodule of tissue was present with in the dermoid.  This was all removed without spill into the pelvis.  Trochar was replaced.     Next the endometriosis was biopsied and then cauterized with the harmonic scalpel until no abnormal appearing tissue was present.  The deep endometriosis was present on the lower uterine segment and upper right pelvic side wall so this was safe to cauterize more deeply.  Pelvis was irrigated.  Inner aspect of the ovary was visualized.  No active bleeding was noted.  Ovary was wrapped in Interceed to help prevent adhesions.  No active bleeding was noted.  Co2 pressures were lowered and no active bleeding noted.  LLQ port was removed and fascia was closed using the Carter-Thomason facial closure device.  Pneumoperitoneum was fully relieved after pt was taken out of Trendelenburg positioning.  Several deep breaths were given prior to removal of remaining trochars.  Pt tolerated procedure well.    The skin was then closed with subcuticular stitches of 3-0 Vicryl. The skin was cleansed Dermabond was applied.  Foley catheter was removed.  Vaginal instruments were removed.  Minimal bleeding was noted.  Sponge, lap, needle, initially counts were correct x2. Patient tolerated the procedure very well. She was awakened from anesthesia, extubated and taken to recovery in stable condition.   COUNTS:  YES  PLAN OF CARE: Transfer to PACU

## 2020-04-23 NOTE — Transfer of Care (Signed)
Immediate Anesthesia Transfer of Care Note  Patient: Sherri Warren  Procedure(s) Performed: LAPAROSCOPIC OVARIAN CYSTECTOMY, PERITONEAL BIOPSY, FULGERATION OF ENDOMETRIOSIS (Left Abdomen)  Patient Location: PACU  Anesthesia Type:General  Level of Consciousness: awake, alert  and oriented  Airway & Oxygen Therapy: Patient Spontanous Breathing and Patient connected to face mask oxygen  Post-op Assessment: Report given to RN and Post -op Vital signs reviewed and stable  Post vital signs: Reviewed and stable  Last Vitals:  Vitals Value Taken Time  BP 123/64 04/23/20 0922  Temp    Pulse 89   Resp 15 04/23/20 0923  SpO2 100%   Vitals shown include unvalidated device data.  Last Pain:  Vitals:   04/23/20 0605  TempSrc: Oral  PainSc: 0-No pain      Patients Stated Pain Goal: 4 (34/03/52 4818)  Complications: No complications documented.

## 2020-04-24 ENCOUNTER — Encounter (HOSPITAL_BASED_OUTPATIENT_CLINIC_OR_DEPARTMENT_OTHER): Payer: Self-pay | Admitting: Obstetrics & Gynecology

## 2020-04-24 LAB — CYTOLOGY - NON PAP

## 2020-04-24 LAB — SURGICAL PATHOLOGY

## 2020-04-30 ENCOUNTER — Ambulatory Visit (INDEPENDENT_AMBULATORY_CARE_PROVIDER_SITE_OTHER): Payer: 59 | Admitting: Obstetrics & Gynecology

## 2020-04-30 ENCOUNTER — Other Ambulatory Visit: Payer: Self-pay

## 2020-04-30 ENCOUNTER — Encounter (HOSPITAL_BASED_OUTPATIENT_CLINIC_OR_DEPARTMENT_OTHER): Payer: Self-pay | Admitting: Obstetrics & Gynecology

## 2020-04-30 VITALS — BP 130/75 | HR 118 | Ht 67.0 in | Wt 153.0 lb

## 2020-04-30 DIAGNOSIS — Z9889 Other specified postprocedural states: Secondary | ICD-10-CM

## 2020-04-30 DIAGNOSIS — N809 Endometriosis, unspecified: Secondary | ICD-10-CM

## 2020-04-30 DIAGNOSIS — D271 Benign neoplasm of left ovary: Secondary | ICD-10-CM

## 2020-04-30 MED ORDER — IBUPROFEN 800 MG PO TABS: 800.0000 mg | ORAL_TABLET | Freq: Three times a day (TID) | ORAL | 0 refills | Status: AC | PRN

## 2020-04-30 NOTE — Patient Instructions (Signed)
Colace or docusate sodium 100mg .  Take 1 or 2 each day until having regular bowel movement.  Don't take laxatives.

## 2020-04-30 NOTE — Progress Notes (Signed)
GYNECOLOGY  VISIT  CC:   post op recheck  HPI: 30 y.o. [id number] Single Black or African American female here for recheck after undergoing laparoscopic left dermoid resection on 04/23/2020.  She reports no bleeding.  She has LLQ pain.  When asking about activity, she is doing a lot more than she should be doing.  Advised LLQ incision was the largest and has deep stiches so she needs to be more limited in activity to allow this to heal.  Note for return to work with modifications given today.  Bowel function is still sluggish.  Has been having BMs but constipated.  Used laxatives but these really made her cramp a lot.  OTC colace 100mg  bid until function is more normal recommended.  Bladder function is normal.    Pathology reviewed:  Yes and pictures reviewed.  Pt aware endometriosis was present and treated.  Pathology report did not confirm but physical finding were consistent with this.  Questions answered.    MEDS:   Current Outpatient Medications on File Prior to Visit  Medication Sig Dispense Refill  . amitriptyline (ELAVIL) 75 MG tablet Take 75 mg by mouth at bedtime.     . Lurasidone HCl (LATUDA) 60 MG TABS Take 60 mg by mouth at bedtime.    . Venlafaxine HCl 75 MG TB24 Take 2 tablets by mouth daily.     No current facility-administered medications on file prior to visit.    SH:  Single, non smoker  PHYSICAL EXAMINATION:    BP 130/75 (BP Location: Right Arm, Patient Position: Sitting, Cuff Size: Normal)   Pulse (!) 118   Ht 5\' 7"  (1.702 m)   Wt 153 lb (69.4 kg)   LMP 04/08/2020 (Exact Date)   Breastfeeding No   BMI 23.96 kg/m     General appearance: alert, cooperative and appears stated age Abdomen: soft, mild LLQ incisional pain, bowel sounds normal; no masses,  no organomegaly Inc:  C/D/I Lymph:  no inguinal LAD noted  Assessment/Plan: 1. Post-operative state - activity modifications given - RF for motrin 600mg  QID prn given.  Advised to take twice daily for next week  for so, with food. - not for work modification and return to work signed.  Pt has specific paperwork that needed to be complete  2. Dermoid cyst of left ovary - s/p resection  3.  Endometriosis - no additional treatment recommendations made at this time.

## 2020-05-01 DIAGNOSIS — D271 Benign neoplasm of left ovary: Secondary | ICD-10-CM | POA: Insufficient documentation

## 2020-05-01 DIAGNOSIS — N809 Endometriosis, unspecified: Secondary | ICD-10-CM | POA: Insufficient documentation

## 2020-12-24 ENCOUNTER — Encounter (HOSPITAL_COMMUNITY): Payer: Self-pay | Admitting: Emergency Medicine

## 2020-12-24 ENCOUNTER — Emergency Department (HOSPITAL_COMMUNITY)
Admission: EM | Admit: 2020-12-24 | Discharge: 2020-12-24 | Disposition: A | Discharge status: Home / Self Care | Payer: 59 | Attending: Emergency Medicine | Admitting: Emergency Medicine

## 2020-12-24 ENCOUNTER — Other Ambulatory Visit: Payer: Self-pay

## 2020-12-24 DIAGNOSIS — Z3A08 8 weeks gestation of pregnancy: Secondary | ICD-10-CM | POA: Diagnosis not present

## 2020-12-24 DIAGNOSIS — N9489 Other specified conditions associated with female genital organs and menstrual cycle: Secondary | ICD-10-CM | POA: Diagnosis not present

## 2020-12-24 DIAGNOSIS — E860 Dehydration: Secondary | ICD-10-CM | POA: Diagnosis not present

## 2020-12-24 DIAGNOSIS — R300 Dysuria: Secondary | ICD-10-CM | POA: Diagnosis not present

## 2020-12-24 DIAGNOSIS — O211 Hyperemesis gravidarum with metabolic disturbance: Secondary | ICD-10-CM | POA: Diagnosis not present

## 2020-12-24 DIAGNOSIS — R5383 Other fatigue: Secondary | ICD-10-CM | POA: Diagnosis not present

## 2020-12-24 DIAGNOSIS — O219 Vomiting of pregnancy, unspecified: Secondary | ICD-10-CM | POA: Diagnosis not present

## 2020-12-24 DIAGNOSIS — R110 Nausea: Secondary | ICD-10-CM | POA: Insufficient documentation

## 2020-12-24 DIAGNOSIS — O210 Mild hyperemesis gravidarum: Secondary | ICD-10-CM

## 2020-12-24 LAB — COMPREHENSIVE METABOLIC PANEL
ALT: 26 U/L (ref 0–44)
AST: 30 U/L (ref 15–41)
Albumin: 4.6 g/dL (ref 3.5–5.0)
Alkaline Phosphatase: 48 U/L (ref 38–126)
Anion gap: 9 (ref 5–15)
BUN: 10 mg/dL (ref 6–20)
CO2: 24 mmol/L (ref 22–32)
Calcium: 9.5 mg/dL (ref 8.9–10.3)
Chloride: 104 mmol/L (ref 98–111)
Creatinine, Ser: 0.7 mg/dL (ref 0.44–1.00)
GFR, Estimated: >60 mL/min (ref >60)
Glucose, Bld: 104 mg/dL — ABNORMAL HIGH (ref 70–99)
Potassium: 3.9 mmol/L (ref 3.5–5.1)
Sodium: 137 mmol/L (ref 135–145)
Total Bilirubin: 1.3 mg/dL — ABNORMAL HIGH (ref 0.3–1.2)
Total Protein: 7.7 g/dL (ref 6.5–8.1)

## 2020-12-24 LAB — URINALYSIS, MICROSCOPIC (REFLEX)

## 2020-12-24 LAB — URINALYSIS, ROUTINE W REFLEX MICROSCOPIC
Bilirubin Urine: NEGATIVE
Glucose, UA: NEGATIVE mg/dL
Hgb urine dipstick: NEGATIVE
Ketones, ur: >80 mg/dL — AB
Leukocytes,Ua: NEGATIVE
Nitrite: NEGATIVE
Specific Gravity, Urine: >1.03 — ABNORMAL HIGH (ref 1.005–1.030)
pH: 6 (ref 5.0–8.0)

## 2020-12-24 LAB — CBC
HCT: 38.5 % (ref 36.0–46.0)
Hemoglobin: 13.5 g/dL (ref 12.0–15.0)
MCH: 34.7 pg — ABNORMAL HIGH (ref 26.0–34.0)
MCHC: 35.1 g/dL (ref 30.0–36.0)
MCV: 99 fL (ref 80.0–100.0)
Platelets: 224 10*3/uL (ref 150–400)
RBC: 3.89 MIL/uL (ref 3.87–5.11)
RDW: 12.7 % (ref 11.5–15.5)
WBC: 14.4 10*3/uL — ABNORMAL HIGH (ref 4.0–10.5)
nRBC: 0 % (ref 0.0–0.2)

## 2020-12-24 LAB — HCG, QUANTITATIVE, PREGNANCY: hCG, Beta Chain, Quant, S: 78246 m[IU]/mL — ABNORMAL HIGH (ref <5)

## 2020-12-24 LAB — LIPASE, BLOOD: Lipase: 27 U/L (ref 11–51)

## 2020-12-24 MED ORDER — METOCLOPRAMIDE HCL 10 MG PO TABS: 10.0000 mg | ORAL_TABLET | Freq: Four times a day (QID) | ORAL | 0 refills | Status: AC

## 2020-12-24 MED ORDER — SODIUM CHLORIDE 0.9 % IV BOLUS
1000.0000 mL | Freq: Once | INTRAVENOUS | Status: AC
  Administered 2020-12-24: 1000 mL via INTRAVENOUS

## 2020-12-24 MED ORDER — METOCLOPRAMIDE HCL 5 MG/ML IJ SOLN
10.0000 mg | Freq: Once | INTRAMUSCULAR | Status: AC
  Administered 2020-12-24: 10 mg via INTRAVENOUS
  Filled 2020-12-24: qty 2

## 2020-12-24 MED ORDER — METOCLOPRAMIDE HCL 10 MG PO TABS: 10.0000 mg | ORAL_TABLET | Freq: Four times a day (QID) | ORAL | 0 refills | Status: DC

## 2020-12-24 NOTE — ED Triage Notes (Signed)
Pt is [redacted] wks pregnant, has been vomiting with body shakes x1 week. Has been seen in office for urinary frequency recently and showed ketones in urine, was told to come to ER if nausea medications provided did not relieve sx. Did not require abx tx. Denies vaginal bleeding, nausea, SOB.

## 2020-12-24 NOTE — ED Provider Notes (Signed)
[facility] EMERGENCY DEPARTMENT Provider Note   CSN: [id number] Arrival date & time: 12/24/20  1202     History Chief Complaint  Patient presents with   Emesis During Pregnancy    Sherri Warren is a 30 y.o. female who is currently [redacted] weeks pregnant, LMP 11/01/2020, established OB/GYN care with [facility] in [place] and reports a documented intrauterine pregnancy based on ultrasound completed 2 weeks ago presenting for evaluation of uncontrolled nausea and vomiting.  She was seen by her OB/GYN yesterday at which time she was also having some dysuria.  She states her urine was negative for infection but she did have a lot of ketones in her urine.  She was prescribed Zofran after the diclegis that she was taking was also not effective.  She continues to have nausea and vomiting despite Zofran use.  She denies abdominal pain, vaginal bleeding or discharge, fevers or chills, myalgias or any flulike symptoms.  She does report feeling general weakness and fatigue.  She has had no diarrhea or constipation.  She was advised to come in for IV fluids.  Review of chart indicates that patient had a ultrasound on November 11 documenting a single intrauterine pregnancy approximately 6 weeks, her quant that day was 8000.  The history is provided by the patient.      Past Medical History:  Diagnosis Date   Adnexal mass    History of depression    stable 02/2020   History of migraine none in years   History of UTI    Myoclonic jerking issue resolved 8 yrs ago per pt   Polycystic ovary    right ovary, noted on MRI   Wears glasses     Patient Active Problem List   Diagnosis Date Noted   Dermoid cyst of left ovary 05/01/2020   Endometriosis 05/01/2020    Past Surgical History:  Procedure Laterality Date   DILATION AND CURETTAGE OF UTERUS  2017   LAPAROSCOPIC OVARIAN CYSTECTOMY Left 04/23/2020   Procedure: LAPAROSCOPIC OVARIAN CYSTECTOMY, PERITONEAL BIOPSY, FULGERATION OF  ENDOMETRIOSIS;  Surgeon: [name], MD;  Location: [facility];  Service: Gynecology;  Laterality: Left;   LAPAROSCOPIC UNILATERAL SALPINGECTOMY Right 02/20/2019   Procedure: LAPAROSCOPIC RIGHT SALPINGECTOMY WITH REMOVAL OF ECTOPIC PREGNANCY;  Surgeon: Jonnie Kind, MD;  Location: [facility];  Service: Gynecology;  Laterality: Right;   WISDOM TOOTH EXTRACTION  2010     OB History     Gravida  4   Para  1   Term  1   Preterm      AB  1   Living  1      SAB      IAB  1   Ectopic      Multiple      Live Births  1           Family History  Problem Relation Age of Onset   Hypertension Mother    Heart disease Maternal Grandmother    Hypertension Maternal Grandmother    Diabetes Maternal Grandfather    Anesthesia problems Neg Hx    Hypotension Neg Hx    Malignant hyperthermia Neg Hx    Pseudochol deficiency Neg Hx    Other Neg Hx     Social History   Tobacco Use   Smoking status: Never   Smokeless tobacco: Never  Vaping Use   Vaping Use: Never used  Substance Use Topics   Alcohol use: No   Drug use:  No    Home Medications Prior to Admission medications   Medication Sig Start Date End Date Taking? Authorizing Provider  amitriptyline (ELAVIL) 75 MG tablet Take 75 mg by mouth at bedtime.  01/30/19   [provider]  ibuprofen (ADVIL) 800 MG tablet Take 1 tablet (800 mg total) by mouth every 8 (eight) hours as needed. 04/30/20   [name], MD  Lurasidone HCl (LATUDA) 60 MG TABS Take 60 mg by mouth at bedtime.    [provider]  metoCLOPramide (REGLAN) 10 MG tablet Take 1 tablet (10 mg total) by mouth every 6 (six) hours. 12/24/20   Evalee Jefferson, PA-C  Venlafaxine HCl 75 MG TB24 Take 2 tablets by mouth daily.    [provider]    Allergies    Sulfa antibiotics  Review of Systems   Review of Systems  Constitutional:  Negative for fever.  HENT:  Negative for congestion and sore throat.   Eyes:  Negative.   Respiratory:  Negative for chest tightness and shortness of breath.   Cardiovascular:  Negative for chest pain.  Gastrointestinal:  Positive for nausea and vomiting. Negative for abdominal pain, constipation and diarrhea.  Genitourinary: Negative.   Musculoskeletal:  Negative for arthralgias, joint swelling and neck pain.  Skin: Negative.  Negative for rash and wound.  Neurological:  Negative for dizziness, weakness, light-headedness, numbness and headaches.  Psychiatric/Behavioral: Negative.     Physical Exam Updated Vital Signs BP 109/66   Pulse 98   Temp 98.2 F (36.8 C) (Oral)   Resp 16   LMP 11/01/2020 (Approximate)   SpO2 100%   Physical Exam Vitals and nursing note reviewed.  Constitutional:      Appearance: She is well-developed.  HENT:     Head: Normocephalic and atraumatic.  Eyes:     Conjunctiva/sclera: Conjunctivae normal.  Cardiovascular:     Rate and Rhythm: Normal rate and regular rhythm.     Heart sounds: Normal heart sounds.  Pulmonary:     Effort: Pulmonary effort is normal.     Breath sounds: Normal breath sounds. No wheezing.  Abdominal:     General: Bowel sounds are normal. There is no distension.     Palpations: Abdomen is soft.     Tenderness: There is no abdominal tenderness. There is no guarding or rebound.  Musculoskeletal:        General: Normal range of motion.     Cervical back: Normal range of motion.  Skin:    General: Skin is warm and dry.  Neurological:     Mental Status: She is alert.    ED Results / Procedures / Treatments   Labs (all labs ordered are listed, but only abnormal results are displayed) Labs Reviewed  COMPREHENSIVE METABOLIC PANEL - Abnormal; Notable for the following components:      Result Value   Glucose, Bld 104 (*)    Total Bilirubin 1.3 (*)    All other components within normal limits  CBC - Abnormal; Notable for the following components:   WBC 14.4 (*)    MCH 34.7 (*)    All other components  within normal limits  URINALYSIS, ROUTINE W REFLEX MICROSCOPIC - Abnormal; Notable for the following components:   Specific Gravity, Urine >1.030 (*)    Ketones, ur >80 (*)    Protein, ur TRACE (*)    All other components within normal limits  HCG, QUANTITATIVE, PREGNANCY - Abnormal; Notable for the following components:   hCG, Beta Chain,  Quant, [place] 78,246 (*)    All other components within normal limits  URINALYSIS, MICROSCOPIC (REFLEX) - Abnormal; Notable for the following components:   Bacteria, UA FEW (*)    All other components within normal limits  LIPASE, BLOOD    EKG None  Radiology No results found.  Procedures Procedures   Medications Ordered in ED Medications  sodium chloride 0.9 % bolus 1,000 mL (0 mLs Intravenous Stopped 12/24/20 1410)  metoCLOPramide (REGLAN) injection 10 mg (10 mg Intravenous Given 12/24/20 1306)  sodium chloride 0.9 % bolus 1,000 mL (0 mLs Intravenous Stopped 12/24/20 1559)    ED Course  I have reviewed the triage vital signs and the nursing notes.  Pertinent labs & imaging results that were available during my care of the patient were reviewed by me and considered in my medical decision making (see chart for details).    MDM Rules/Calculators/A&P                           Exam is reassuring, patient does not have any acute abdominal findings on her exam.  Her labs reveal that she is significantly dehydrated with greater than 80 ketones in her urine.  Her quant hCG has adequately elevated.  She does not have a UTI, her c-Met is good, borderline elevated total bilirubin but LFTs are normal.  She was given IV fluids, an IV dose of Reglan and was then able to tolerate p.o. fluids without any nausea or vomiting.  She was prescribed Reglan for home use.  Plan follow-up with her OB/GYN as needed.  Return precautions were outlined.    The patient appears reasonably screened and/or stabilized for discharge and I doubt any other medical condition or  other [facility] requiring further screening, evaluation, or treatment in the ED at this time prior to discharge.   Final Clinical Impression(s) / ED Diagnoses Final diagnoses:  Dehydration  Hyperemesis gravidarum    Rx / DC Orders ED Discharge Orders          Ordered    metoCLOPramide (REGLAN) 10 MG tablet  Every 6 hours,   Status:  Discontinued        12/24/20 1602    metoCLOPramide (REGLAN) 10 MG tablet  Every 6 hours        12/24/20 1604             Evalee Jefferson, PA-C 12/24/20 [place], [place], DO 12/25/20 [phone]

## 2020-12-24 NOTE — Discharge Instructions (Signed)
Use the medicine prescribed for nausea and vomiting.  Make sure you are drinking plenty of fluids.  Get rechecked for any worsening or persistent symptoms.

## 2020-12-24 NOTE — ED Notes (Signed)
Tolerating PO fluids °

## 2020-12-24 NOTE — ED Notes (Signed)
Provided with ginger ale

## 2021-05-13 IMAGING — MR MR PELVIS WO/W CM
5 of 10 series · 20 of 48 positions shown · IV contrast (gadavist)
Comparison: Ultrasound 02/20/2019.

CLINICAL DATA: Evaluate ovarian mass

EXAM:
MRI PELVIS WITHOUT AND WITH CONTRAST
TECHNIQUE: Multiplanar multisequence MR imaging of the pelvis was performed
both before and after administration of intravenous contrast.
CONTRAST:  6.9mL GADAVIST GADOBUTROL 1 MMOL/ML IV SOLN

[Series 2: cor ssfse / · coronal · 5.0mm · 1.56mm/px · 3 of 30 slices shown]
[im 1/30]
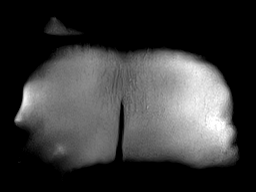
[im 15/30]
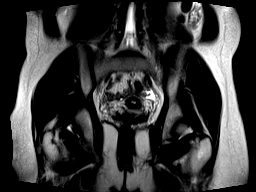
[im 30/30]
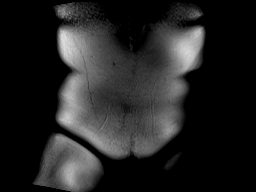

[Series 3: T2 · axial · 5.0mm · 0.55mm/px · z∈[-89,+85]mm · 3 of 30 slices shown (1 of 2)]
[im 1/30]
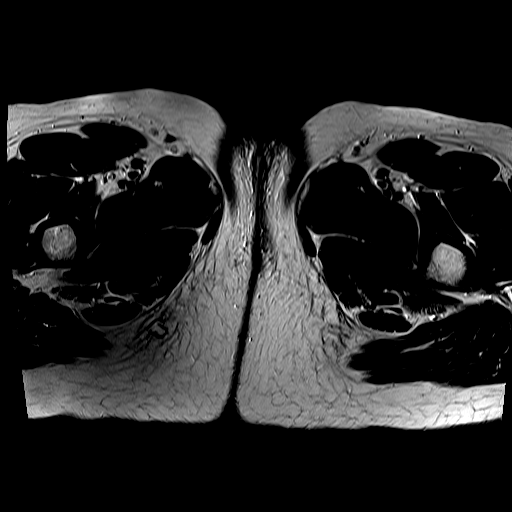
[im 15/30]
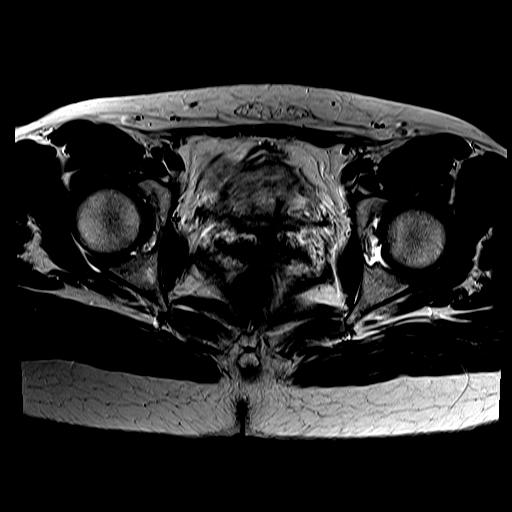
[im 30/30]
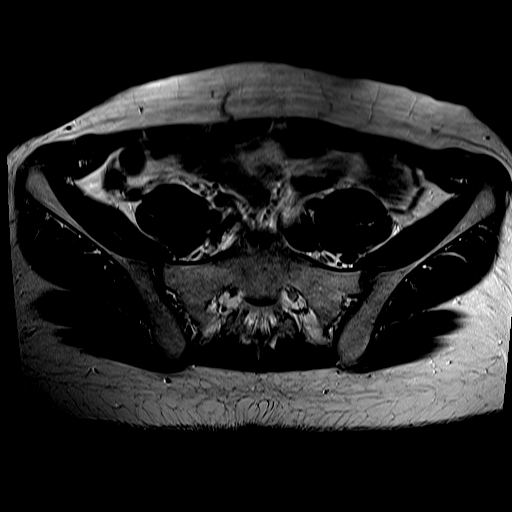

[Series 4: T2 fat-sat · axial · 5.0mm · 0.55mm/px · z∈[-89,+85]mm · 4 of 30 slices shown]
[im 1/30]
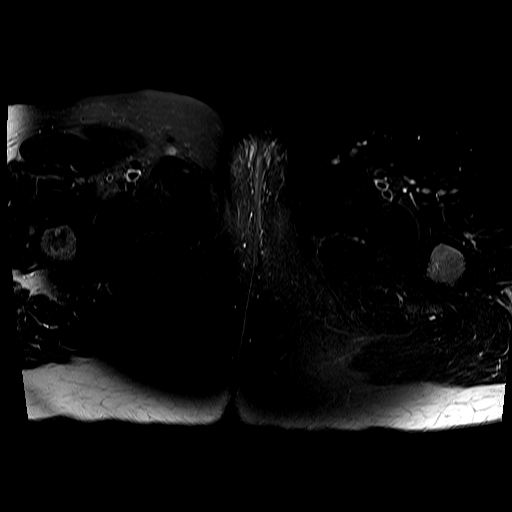
[im 10/30]
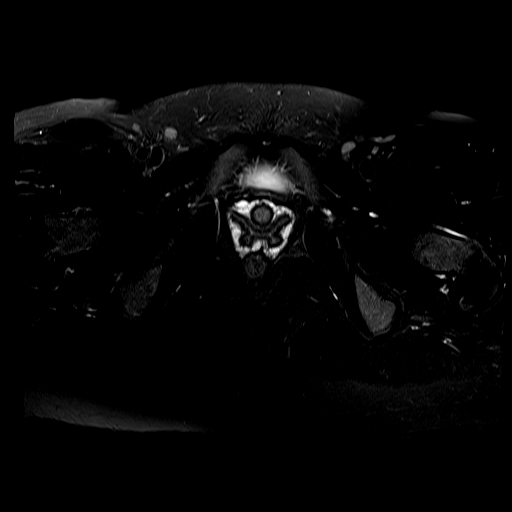
[im 20/30]
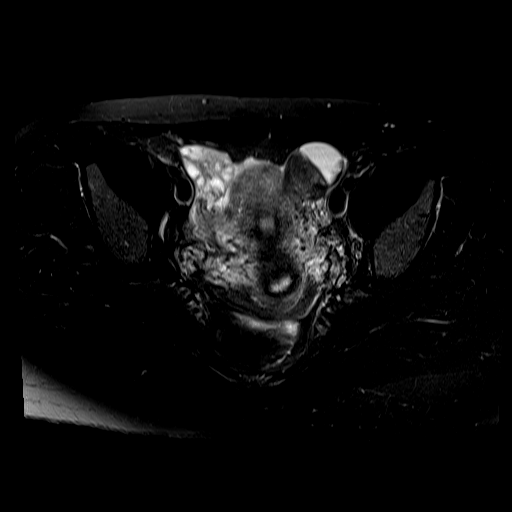
[im 30/30]
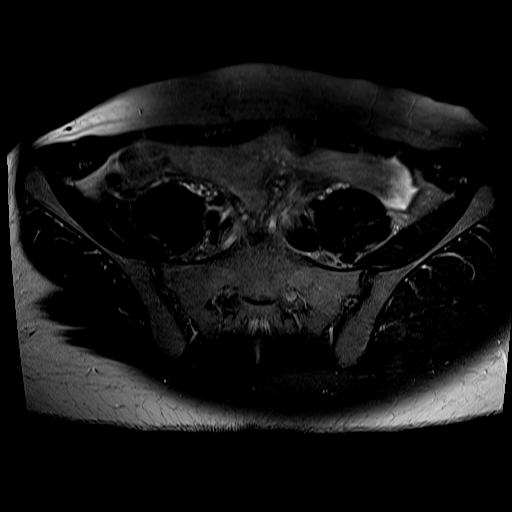

[Series 5: T2 · sagittal · 5.0mm · 0.55mm/px · 3 of 25 slices shown (2 of 2)]
[im 1/25]
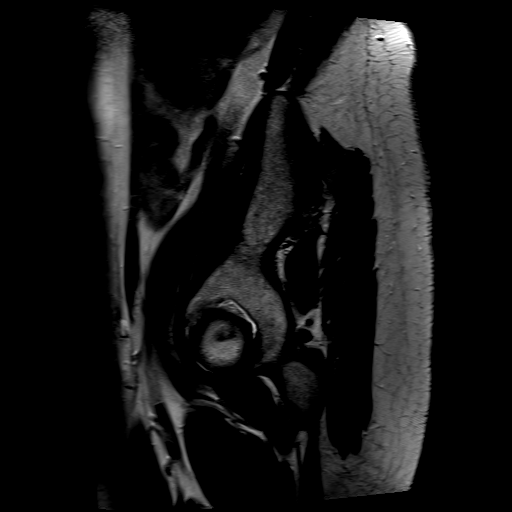
[im 13/25]
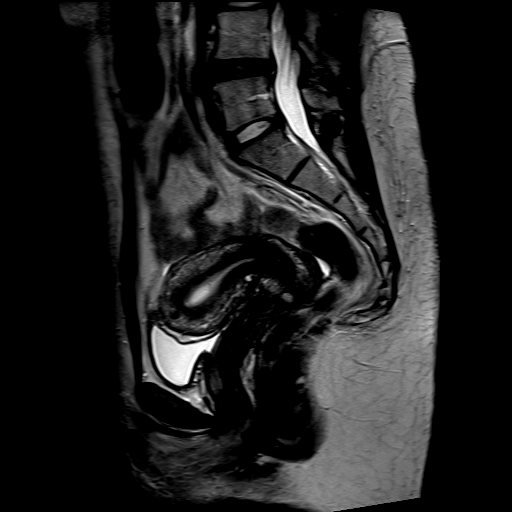
[im 25/25]
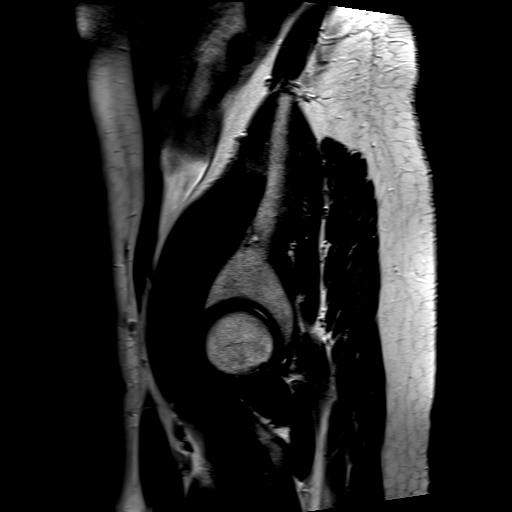

[Series 6: T1 · axial · 5.0mm · 0.55mm/px · z∈[-89,+85]mm · 7 of 60 slices shown]
[im 1/60]
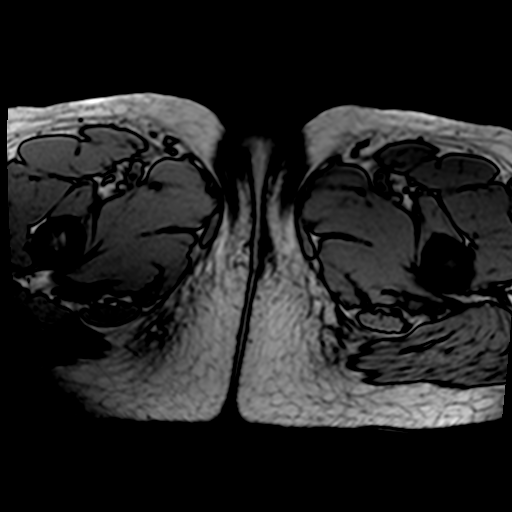
[im 10/60]
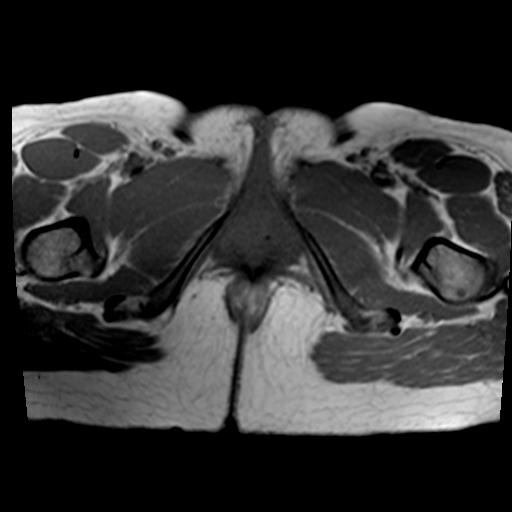
[im 20/60]
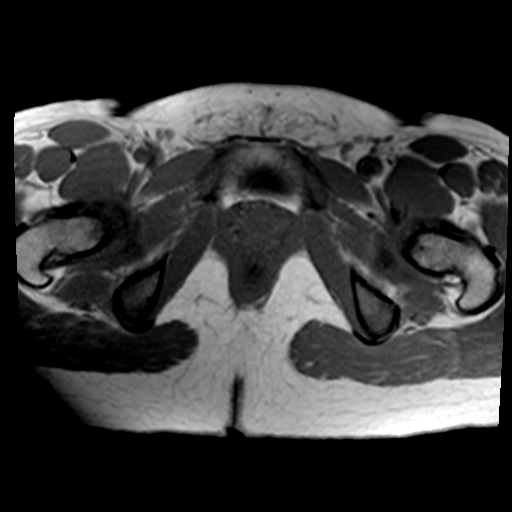
[im 30/60]
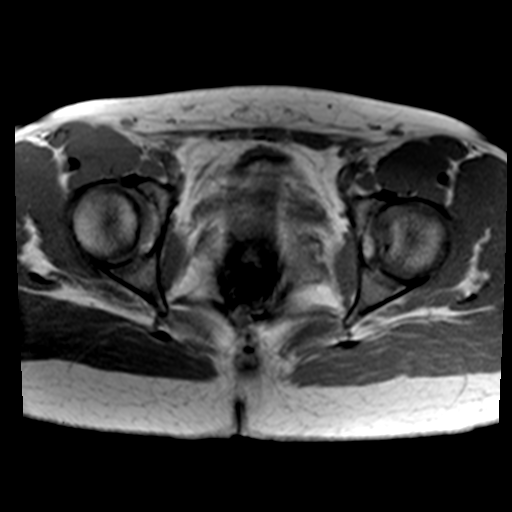
[im 40/60]
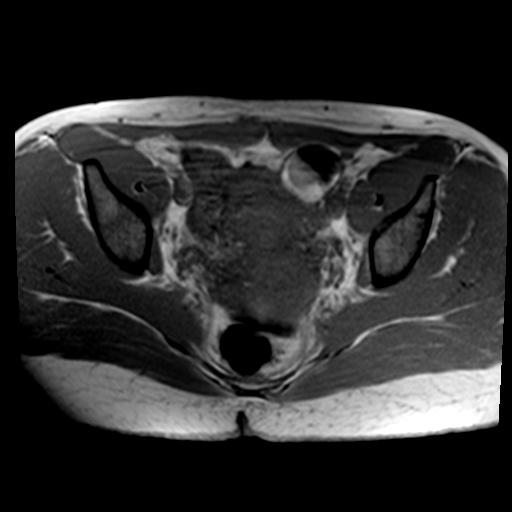
[im 50/60]
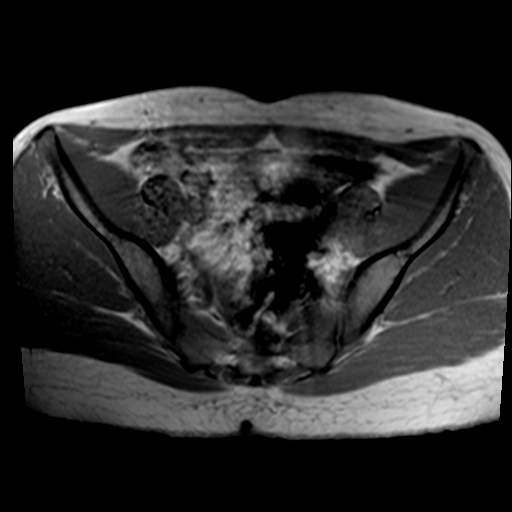
[im 60/60]
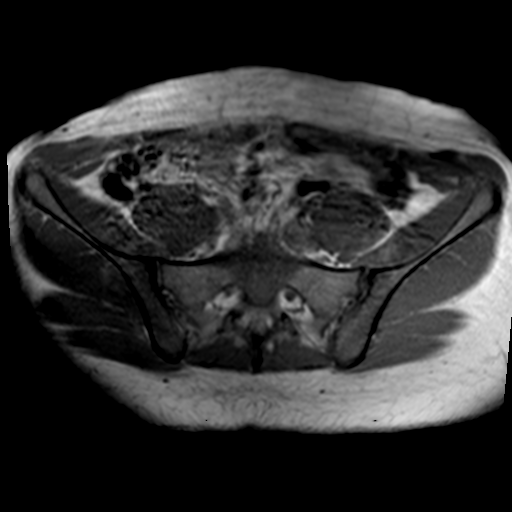

[20 of 48 positions shown; findings below may reference images not displayed]

FINDINGS: Urinary Tract:  No abnormality visualized.

Bowel:  Unremarkable visualized pelvic bowel loops.

Vascular/Lymphatic: No pathologically enlarged lymph nodes. No
significant vascular abnormality seen.

Reproductive:

Uterus: Anteverted measuring 7.8 x 4.3 by 6.1 cm. Volume equals 102
cc. No focal uterine mass.

Endometrium: Measures 7 mm. Normal trilaminar appearance. No focal
abnormality.

Right ovary measures 2.6 by 2.9 by 3.9 cm. Volume equals 14.7 cc.
Multiple prominent peripheral follicles are identified. Corpus
luteum is identified measuring 1.5 cm, image [REDACTED]

Left ovary: Measures 4.1 x 2.9 by 3.7 cm. Volume equals 22 cc.
Complex cystic lesion within the left ovary measures 3.2 x 3.4 by
3.1 cm. This has internal areas of macroscopic fat as well as cystic
and solid components compatible with a teratoma.

Other: Small volume of free fluid within the pelvis identified.
Nonspecific in a premenopausal female. No discrete fluid
collections.

Musculoskeletal: No suspicious bone lesions identified.
IMPRESSION: 1. Left ovarian teratoma.
2. Right ovary is enlarged containing multiple prominent follicles
as well as a corpus luteum cyst. Size of the right ovary meets
criteria for polycystic ovary. Correlate for any clinical
signs/symptoms of PCOS.
3. No right ovary mass identified.
4. Small volume of free fluid within the pelvis identified.
Nonspecific in a premenopausal female.
